# Patient Record
Sex: Male | Born: 1975 | Race: White | Hispanic: No | Marital: Married | Smoking: Never smoker
Health system: Southern US, Community
[De-identification: ages and names within clinical notes are randomized; demographics above are authoritative.]

## PROBLEM LIST (undated history)

## (undated) DIAGNOSIS — Z9189 Other specified personal risk factors, not elsewhere classified: Secondary | ICD-10-CM

## (undated) DIAGNOSIS — J45909 Unspecified asthma, uncomplicated: Secondary | ICD-10-CM

## (undated) DIAGNOSIS — M1990 Unspecified osteoarthritis, unspecified site: Secondary | ICD-10-CM

## (undated) DIAGNOSIS — G5601 Carpal tunnel syndrome, right upper limb: Secondary | ICD-10-CM

## (undated) DIAGNOSIS — K219 Gastro-esophageal reflux disease without esophagitis: Secondary | ICD-10-CM

## (undated) HISTORY — PX: FOOT SURGERY: SHX648

## (undated) NOTE — *Deleted (*Deleted)
Pt reports his oral mucosa feels irritated, upon inspection of tongue- coated brown/white and tongue more reddened. Pt was encouraged to brush his teeth freq ( after meals & before bedtime) & oral rinse after inhaler use.

---

## 1999-01-21 HISTORY — PX: LASIK: SHX215

## 2001-03-02 ENCOUNTER — Encounter: Admission: RE | Admit: 2001-03-02 | Discharge: 2001-03-02 | Payer: Self-pay | Admitting: *Deleted

## 2001-03-02 ENCOUNTER — Encounter: Payer: Self-pay | Admitting: *Deleted

## 2001-03-12 ENCOUNTER — Encounter: Payer: Self-pay | Admitting: *Deleted

## 2001-03-12 ENCOUNTER — Encounter: Admission: RE | Admit: 2001-03-12 | Discharge: 2001-03-12 | Payer: Self-pay | Admitting: *Deleted

## 2001-11-08 ENCOUNTER — Emergency Department (HOSPITAL_COMMUNITY): Admission: EM | Admit: 2001-11-08 | Discharge: 2001-11-08 | Payer: Self-pay | Admitting: Emergency Medicine

## 2003-01-21 HISTORY — PX: WISDOM TOOTH EXTRACTION: SHX21

## 2005-01-15 ENCOUNTER — Ambulatory Visit (HOSPITAL_COMMUNITY): Admission: RE | Admit: 2005-01-15 | Discharge: 2005-01-15 | Payer: Self-pay | Admitting: Family Medicine

## 2005-01-20 HISTORY — PX: SHOULDER ARTHROSCOPY WITH ROTATOR CUFF REPAIR: SHX5685

## 2013-01-20 DIAGNOSIS — G4730 Sleep apnea, unspecified: Secondary | ICD-10-CM

## 2013-01-20 HISTORY — DX: Sleep apnea, unspecified: G47.30

## 2014-04-03 ENCOUNTER — Encounter (HOSPITAL_BASED_OUTPATIENT_CLINIC_OR_DEPARTMENT_OTHER): Payer: Self-pay | Admitting: *Deleted

## 2014-04-05 ENCOUNTER — Encounter (HOSPITAL_BASED_OUTPATIENT_CLINIC_OR_DEPARTMENT_OTHER): Payer: Self-pay | Admitting: *Deleted

## 2014-04-05 NOTE — Anesthesia Preprocedure Evaluation (Addendum)
Anesthesia Evaluation  Patient identified by MRN, date of birth, ID band Patient awake    Reviewed: Allergy & Precautions, NPO status , Patient's Chart, lab work & pertinent test results  History of Anesthesia Complications Negative for: history of anesthetic complications  Airway Mallampati: II  TM Distance: >3 FB Neck ROM: Full    Dental no notable dental hx. (+) Dental Advisory Given   Pulmonary asthma ,  breath sounds clear to auscultation  Pulmonary exam normal       Cardiovascular negative cardio ROS  Rhythm:Regular Rate:Normal     Neuro/Psych negative neurological ROS  negative psych ROS   GI/Hepatic Neg liver ROS, GERD-  Medicated and Controlled,  Endo/Other  obesity  Renal/GU negative Renal ROS  negative genitourinary   Musculoskeletal  (+) Arthritis -, Osteoarthritis,    Abdominal   Peds negative pediatric ROS (+)  Hematology negative hematology ROS (+)   Anesthesia Other Findings   Reproductive/Obstetrics negative OB ROS                            Anesthesia Physical Anesthesia Plan  ASA: II  Anesthesia Plan: MAC   Post-op Pain Management:    Induction: Intravenous  Airway Management Planned:   Additional Equipment:   Intra-op Plan:   Post-operative Plan: Extubation in OR  Informed Consent: I have reviewed the patients History and Physical, chart, labs and discussed the procedure including the risks, benefits and alternatives for the proposed anesthesia with the patient or authorized representative who has indicated his/her understanding and acceptance.   Dental advisory given  Plan Discussed with: CRNA  Anesthesia Plan Comments:        Anesthesia Quick Evaluation

## 2014-04-05 NOTE — Progress Notes (Signed)
NPO AFTER MN WITH EXCEPTION CLEAR LIQUIDS UNTIL 0730 (NO CREAM/ MILK PRODUCTS). PT VERBALIZED UNDERSTANDING NO DIP TOBACCO AFTER 0730. ARRIVE AT 1215. NEEDS HG.  WILL TAKE AM MEDS W/ SIPS OF WATER DOS.

## 2014-04-05 NOTE — Progress Notes (Signed)
   04/05/14 0935  OBSTRUCTIVE SLEEP APNEA  Have you ever been diagnosed with sleep apnea through a sleep study? No  Do you snore loudly (loud enough to be heard through closed doors)?  1  Do you often feel tired, fatigued, or sleepy during the daytime? 0  Has anyone observed you stop breathing during your sleep? 0  Do you have, or are you being treated for high blood pressure? 0  BMI more than 35 kg/m2? 1  Age over 40 years old? 0  Neck circumference greater than 40 cm/16 inches? 1  Gender: 1  Obstructive Sleep Apnea Score 4

## 2014-04-06 ENCOUNTER — Encounter (HOSPITAL_BASED_OUTPATIENT_CLINIC_OR_DEPARTMENT_OTHER)
Admission: RE | Disposition: A | Discharge status: Home / Self Care | Payer: Self-pay | Source: Ambulatory Visit | Attending: Orthopedic Surgery

## 2014-04-06 ENCOUNTER — Ambulatory Visit (HOSPITAL_BASED_OUTPATIENT_CLINIC_OR_DEPARTMENT_OTHER): Payer: 59 | Admitting: Anesthesiology

## 2014-04-06 ENCOUNTER — Ambulatory Visit (HOSPITAL_BASED_OUTPATIENT_CLINIC_OR_DEPARTMENT_OTHER)
Admission: RE | Admit: 2014-04-06 | Discharge: 2014-04-06 | Disposition: A | Discharge status: Home / Self Care | Payer: 59 | Source: Ambulatory Visit | Attending: Orthopedic Surgery | Admitting: Orthopedic Surgery

## 2014-04-06 ENCOUNTER — Encounter (HOSPITAL_BASED_OUTPATIENT_CLINIC_OR_DEPARTMENT_OTHER): Payer: Self-pay | Admitting: *Deleted

## 2014-04-06 DIAGNOSIS — M1990 Unspecified osteoarthritis, unspecified site: Secondary | ICD-10-CM | POA: Diagnosis not present

## 2014-04-06 DIAGNOSIS — J45909 Unspecified asthma, uncomplicated: Secondary | ICD-10-CM | POA: Diagnosis not present

## 2014-04-06 DIAGNOSIS — G4733 Obstructive sleep apnea (adult) (pediatric): Secondary | ICD-10-CM | POA: Diagnosis not present

## 2014-04-06 DIAGNOSIS — G5601 Carpal tunnel syndrome, right upper limb: Secondary | ICD-10-CM

## 2014-04-06 DIAGNOSIS — K219 Gastro-esophageal reflux disease without esophagitis: Secondary | ICD-10-CM | POA: Insufficient documentation

## 2014-04-06 DIAGNOSIS — Z79899 Other long term (current) drug therapy: Secondary | ICD-10-CM | POA: Insufficient documentation

## 2014-04-06 HISTORY — PX: CARPAL TUNNEL RELEASE: SHX101

## 2014-04-06 HISTORY — DX: Gastro-esophageal reflux disease without esophagitis: K21.9

## 2014-04-06 HISTORY — DX: Other specified personal risk factors, not elsewhere classified: Z91.89

## 2014-04-06 HISTORY — DX: Unspecified asthma, uncomplicated: J45.909

## 2014-04-06 HISTORY — DX: Unspecified osteoarthritis, unspecified site: M19.90

## 2014-04-06 HISTORY — DX: Carpal tunnel syndrome, right upper limb: G56.01

## 2014-04-06 LAB — POCT HEMOGLOBIN-HEMACUE: Hemoglobin: 15.5 g/dL (ref 13.0–17.0)

## 2014-04-06 SURGERY — CARPAL TUNNEL RELEASE
Anesthesia: Monitor Anesthesia Care | Site: Wrist | Laterality: Right

## 2014-04-06 MED ORDER — 0.9 % SODIUM CHLORIDE (POUR BTL) OPTIME
TOPICAL | Status: DC | PRN
  Administered 2014-04-06: 1000 mL

## 2014-04-06 MED ORDER — FENTANYL CITRATE 0.05 MG/ML IJ SOLN
25.0000 ug | INTRAMUSCULAR | Status: DC | PRN
  Filled 2014-04-06: qty 1

## 2014-04-06 MED ORDER — LIDOCAINE HCL (PF) 1 % IJ SOLN
INTRAMUSCULAR | Status: DC | PRN
  Administered 2014-04-06: 10 mL

## 2014-04-06 MED ORDER — CHLORHEXIDINE GLUCONATE 4 % EX LIQD
60.0000 mL | Freq: Once | CUTANEOUS | Status: DC
  Filled 2014-04-06: qty 60

## 2014-04-06 MED ORDER — LIDOCAINE HCL (CARDIAC) 20 MG/ML IV SOLN
INTRAVENOUS | Status: DC | PRN
  Administered 2014-04-06: 50 mg via INTRAVENOUS

## 2014-04-06 MED ORDER — BUPIVACAINE HCL (PF) 0.25 % IJ SOLN
INTRAMUSCULAR | Status: DC | PRN
  Administered 2014-04-06: 10 mL

## 2014-04-06 MED ORDER — OXYCODONE-ACETAMINOPHEN 5-325 MG PO TABS: 1.0000 | ORAL_TABLET | ORAL | Status: DC | PRN

## 2014-04-06 MED ORDER — MIDAZOLAM HCL 5 MG/5ML IJ SOLN
INTRAMUSCULAR | Status: DC | PRN
  Administered 2014-04-06: 2 mg via INTRAVENOUS

## 2014-04-06 MED ORDER — MIDAZOLAM HCL 2 MG/2ML IJ SOLN
INTRAMUSCULAR | Status: AC
  Filled 2014-04-06: qty 2

## 2014-04-06 MED ORDER — CEFAZOLIN SODIUM 10 G IJ SOLR
3.0000 g | INTRAMUSCULAR | Status: AC
  Administered 2014-04-06: 3 g via INTRAVENOUS
  Filled 2014-04-06: qty 3000

## 2014-04-06 MED ORDER — PROPOFOL 10 MG/ML IV BOLUS
INTRAVENOUS | Status: DC | PRN
  Administered 2014-04-06: 30 mg via INTRAVENOUS

## 2014-04-06 MED ORDER — LACTATED RINGERS IV SOLN
INTRAVENOUS | Status: DC
Start: 2014-04-06 — End: 2014-04-06
  Administered 2014-04-06: 13:00:00 via INTRAVENOUS
  Filled 2014-04-06: qty 1000

## 2014-04-06 MED ORDER — FENTANYL CITRATE 0.05 MG/ML IJ SOLN
INTRAMUSCULAR | Status: DC | PRN
  Administered 2014-04-06: 50 ug via INTRAVENOUS

## 2014-04-06 MED ORDER — DOCUSATE SODIUM 100 MG PO CAPS: 100.0000 mg | ORAL_CAPSULE | Freq: Two times a day (BID) | ORAL | Status: DC

## 2014-04-06 MED ORDER — CEFAZOLIN SODIUM 1-5 GM-% IV SOLN
INTRAVENOUS | Status: AC
  Filled 2014-04-06: qty 50

## 2014-04-06 MED ORDER — OXYCODONE-ACETAMINOPHEN 5-325 MG PO TABS
1.0000 | ORAL_TABLET | ORAL | Status: DC | PRN
  Administered 2014-04-06: 1 via ORAL
  Filled 2014-04-06: qty 1

## 2014-04-06 MED ORDER — CEFAZOLIN SODIUM-DEXTROSE 2-3 GM-% IV SOLR
INTRAVENOUS | Status: AC
  Filled 2014-04-06: qty 50

## 2014-04-06 MED ORDER — ALBUTEROL SULFATE HFA 108 (90 BASE) MCG/ACT IN AERS
2.0000 | INHALATION_SPRAY | Freq: Once | RESPIRATORY_TRACT | Status: AC
  Administered 2014-04-06: 2 via RESPIRATORY_TRACT
  Filled 2014-04-06: qty 6.7

## 2014-04-06 MED ORDER — OXYCODONE-ACETAMINOPHEN 5-325 MG PO TABS
ORAL_TABLET | ORAL | Status: AC
  Filled 2014-04-06: qty 1

## 2014-04-06 MED ORDER — ONDANSETRON HCL 4 MG/2ML IJ SOLN
4.0000 mg | Freq: Once | INTRAMUSCULAR | Status: DC | PRN
  Filled 2014-04-06: qty 2

## 2014-04-06 MED ORDER — PROPOFOL 10 MG/ML IV EMUL
INTRAVENOUS | Status: DC | PRN
  Administered 2014-04-06: 200 ug/kg/min via INTRAVENOUS

## 2014-04-06 MED ORDER — FENTANYL CITRATE 0.05 MG/ML IJ SOLN
INTRAMUSCULAR | Status: AC
  Filled 2014-04-06: qty 4

## 2014-04-06 SURGICAL SUPPLY — 41 items
BANDAGE ELASTIC 3 VELCRO ST LF (GAUZE/BANDAGES/DRESSINGS) ×2 IMPLANT
BLADE SURG 15 STRL LF DISP TIS (BLADE) ×1 IMPLANT
BLADE SURG 15 STRL SS (BLADE) ×2
BNDG CMPR 9X4 STRL LF SNTH (GAUZE/BANDAGES/DRESSINGS) ×1
BNDG CONFORM 3 STRL LF (GAUZE/BANDAGES/DRESSINGS) ×2 IMPLANT
BNDG ESMARK 4X9 LF (GAUZE/BANDAGES/DRESSINGS) ×2 IMPLANT
CORDS BIPOLAR (ELECTRODE) IMPLANT
COVER TABLE BACK 60X90 (DRAPES) ×2 IMPLANT
CUFF TOURNIQUET SINGLE 18IN (TOURNIQUET CUFF) ×2 IMPLANT
DRAPE EXTREMITY T 121X128X90 (DRAPE) ×2 IMPLANT
DRAPE LG THREE QUARTER DISP (DRAPES) ×2 IMPLANT
DRAPE SURG 17X23 STRL (DRAPES) ×2 IMPLANT
DRSG EMULSION OIL 3X3 NADH (GAUZE/BANDAGES/DRESSINGS) IMPLANT
GAUZE XEROFORM 1X8 LF (GAUZE/BANDAGES/DRESSINGS) ×2 IMPLANT
GLOVE BIO SURGEON STRL SZ8 (GLOVE) ×2 IMPLANT
GLOVE BIOGEL PI IND STRL 8.5 (GLOVE) ×1 IMPLANT
GLOVE BIOGEL PI INDICATOR 8.5 (GLOVE) ×1
GLOVE INDICATOR 7.5 STRL GRN (GLOVE) ×1 IMPLANT
GLOVE SURG SS PI 7.5 STRL IVOR (GLOVE) ×2 IMPLANT
GOWN STRL REUS W/ TWL LRG LVL3 (GOWN DISPOSABLE) ×1 IMPLANT
GOWN STRL REUS W/ TWL XL LVL3 (GOWN DISPOSABLE) ×1 IMPLANT
GOWN STRL REUS W/TWL LRG LVL3 (GOWN DISPOSABLE) ×2
GOWN STRL REUS W/TWL XL LVL3 (GOWN DISPOSABLE) ×2
KNIFE CARPAL TUNNEL (BLADE) IMPLANT
NDL HYPO 25X1 1.5 SAFETY (NEEDLE) ×2 IMPLANT
NDL SAFETY ECLIPSE 18X1.5 (NEEDLE) IMPLANT
NEEDLE HYPO 18GX1.5 SHARP (NEEDLE) ×8
NEEDLE HYPO 25X1 1.5 SAFETY (NEEDLE) ×4 IMPLANT
NS IRRIG 500ML POUR BTL (IV SOLUTION) ×2 IMPLANT
PACK BASIN DAY SURGERY FS (CUSTOM PROCEDURE TRAY) ×2 IMPLANT
PAD ALCOHOL SWAB (MISCELLANEOUS) ×8 IMPLANT
PAD CAST 3X4 CTTN HI CHSV (CAST SUPPLIES) IMPLANT
PADDING CAST COTTON 3X4 STRL (CAST SUPPLIES) ×2
SPONGE GAUZE 4X4 12PLY STER LF (GAUZE/BANDAGES/DRESSINGS) ×2 IMPLANT
STOCKINETTE 4X48 STRL (DRAPES) ×2 IMPLANT
SUT PROLENE 4 0 PS 2 18 (SUTURE) ×2 IMPLANT
SYR BULB 3OZ (MISCELLANEOUS) ×2 IMPLANT
SYR CONTROL 10ML LL (SYRINGE) ×4 IMPLANT
TOWEL OR 17X24 6PK STRL BLUE (TOWEL DISPOSABLE) ×2 IMPLANT
TRAY DSU PREP LF (CUSTOM PROCEDURE TRAY) ×2 IMPLANT
UNDERPAD 30X30 INCONTINENT (UNDERPADS AND DIAPERS) ×2 IMPLANT

## 2014-04-06 NOTE — Brief Op Note (Signed)
04/06/2014  1:43 PM  PATIENT:  Chriss Driverhristopher A Harshfield  39 y.o. male  PRE-OPERATIVE DIAGNOSIS:  RIGHT HAND CARPAL TUNNEL SYNDROME  POST-OPERATIVE DIAGNOSIS:  * No post-op diagnosis entered *  PROCEDURE:  Procedure(s): RIGHT CARPAL TUNNEL RELEASE (Right)  SURGEON:  Surgeon(s) and Role:    * Bradly BienenstockFred Gavriel Holzhauer, MD - Primary  PHYSICIAN ASSISTANT:   ASSISTANTS: none   ANESTHESIA:   MAC  EBL:     BLOOD ADMINISTERED:none  DRAINS: none   LOCAL MEDICATIONS USED:  MARCAINE     SPECIMEN:  No Specimen  DISPOSITION OF SPECIMEN:  N/A  COUNTS:  YES  TOURNIQUET:    DICTATION: .Other Dictation: Dictation Number [id number]  PLAN OF CARE: Discharge to home after PACU  PATIENT DISPOSITION:  PACU - hemodynamically stable.   Delay start of Pharmacological VTE agent (>24hrs) due to surgical blood loss or risk of bleeding: not applicable

## 2014-04-06 NOTE — Anesthesia Procedure Notes (Signed)
Procedure Name: MAC Date/Time: 04/06/2014 2:45 PM Performed by: Tyrone NineSAUVE, Esmirna Ravan F Pre-anesthesia Checklist: Patient identified, Timeout performed, Emergency Drugs available, Suction available and Patient being monitored Patient Re-evaluated:Patient Re-evaluated prior to inductionOxygen Delivery Method: Nasal cannula Preoxygenation: Pre-oxygenation with 100% oxygen Intubation Type: IV induction Placement Confirmation: positive ETCO2 Tube secured with: Tape Dental Injury: Teeth and Oropharynx as per pre-operative assessment

## 2014-04-06 NOTE — H&P (Signed)
Kenneth [name] is an 39 y.o. male.   Chief Complaint: right hand numbness HPI: s/s c/[name] right hand carpal tunnel syndrome Pt here for surgery No prior surgery to right hand   Past Medical History  Diagnosis Date  . Carpal tunnel syndrome, right   . GERD (gastroesophageal reflux disease)   . Asthma due to environmental allergies   . At risk for sleep apnea     STOP-BANG= 4       SENT TO PCP 04-05-2014  . Arthritis     BACK    Past Surgical History  Procedure Laterality Date  . Wisdom tooth extraction  2005  . Shoulder arthroscopy with rotator cuff repair Left 2007    History reviewed. No pertinent family history. Social History:  reports that he has never smoked. His smokeless tobacco use includes Snuff. He reports that he drinks alcohol. He reports that he does not use illicit drugs.  Allergies: No Known Allergies  Medications Prior to Admission  Medication Sig Dispense Refill  . albuterol (PROVENTIL) (5 MG/ML) 0.5% nebulizer solution Take 2.5 mg by nebulization every 6 (six) hours as needed for wheezing or shortness of breath.    . cetirizine (ZYRTEC) 10 MG tablet Take 10 mg by mouth every morning.    Marland Kitchen. omeprazole (PRILOSEC) 20 MG capsule Take 20 mg by mouth 2 (two) times daily before a meal.      Results for orders placed or performed during the hospital encounter of 04/06/14 (from the past 48 hour(s))  Hemoglobin-hemacue, POC     Status: None   Collection Time: 04/06/14  1:22 PM  Result Value Ref Range   Hemoglobin 15.5 13.0 - 17.0 g/dL   No results found.  ROSNO RECENT ILLNESSES OR HOSPITALIZATIONS  Blood pressure 120/80, pulse 64, temperature 98.6 F (37 C), temperature source Oral, resp. rate 16, height 6\' 2"  (1.88 m), weight 126.554 kg (279 lb), SpO2 97 %. Physical Exam  General Appearance:  Alert, cooperative, no distress, appears stated age  Head:  Normocephalic, without obvious abnormality, atraumatic  Eyes:  Pupils equal, conjunctiva/corneas clear,          Throat: Lips, mucosa, and tongue normal; teeth and gums normal  Neck: No visible masses     Lungs:   respirations unlabored  Chest Wall:  No tenderness or deformity  Heart:  Regular rate and rhythm,  Abdomen:   Soft, non-tender,         Extremities: RIGHT HAND: FINGERS WARM WELL PERFUSED GOOD THUMB MOBILITY ABLE TO PALMAR ABDUCT THUMB GOOD DIGITAL MOTION  Pulses: 2+ and symmetric  Skin: Skin color, texture, turgor normal, no rashes or lesions     Neurologic: Normal    Assessment/Plan RIGHT HAND CARPAL TUNNEL SYNDROME  RIGHT HAND CARPAL TUNNEL RELEASE  R/B/A DISCUSSED WITH PT IN OFFICE.  PT VOICED UNDERSTANDING OF PLAN CONSENT SIGNED DAY OF SURGERY PT SEEN AND EXAMINED PRIOR TO OPERATIVE PROCEDURE/DAY OF SURGERY SITE MARKED. QUESTIONS ANSWERED WILL GO HOME FOLLOWING SURGERY  WE ARE PLANNING SURGERY FOR YOUR UPPER EXTREMITY. THE RISKS AND BENEFITS OF SURGERY INCLUDE BUT NOT LIMITED TO BLEEDING INFECTION, DAMAGE TO NEARBY NERVES ARTERIES TENDONS, FAILURE OF SURGERY TO ACCOMPLISH ITS INTENDED GOALS, PERSISTENT SYMPTOMS AND NEED FOR FURTHER SURGICAL INTERVENTION. WITH THIS IN MIND WE WILL PROCEED. I HAVE DISCUSSED WITH THE PATIENT THE PRE AND POSTOPERATIVE REGIMEN AND THE DOS AND DON'TS. PT VOICED UNDERSTANDING AND INFORMED CONSENT SIGNED.  Kenneth [name],Kenneth [name] 04/06/2014, 1:43 PM

## 2014-04-06 NOTE — Discharge Instructions (Addendum)
KEEP BANDAGE CLEAN AND DRY CALL OFFICE FOR F/U APPT [phone] IN 14 DAYS KEEP HAND ELEVATED ABOVE HEART OK TO APPLY ICE TO OPERATIVE AREA CONTACT OFFICE IF ANY WORSENING PAIN OR CONCERNS. Call your surgeon if you experience:   1.  Fever over 101.0. 2.  Inability to urinate. 3.  Nausea and/or vomiting. 4.  Extreme swelling or bruising at the surgical site. 5.  Continued bleeding from the incision. 6.  Increased pain, redness or drainage from the incision. 7.  Problems related to your pain medication. 8. Any change in color, movement and/or sensation 9. Any problems and/or concernsCall your surgeon if you experience:   1.  Fever over 101.0. 2.  Inability to urinate. 3.  Nausea and/or vomiting. 4.  Extreme swelling or bruising at the surgical site. 5.  Continued bleeding from the incision. 6.  Increased pain, redness or drainage from the incision. 7.  Problems related to your pain medication. 8. Any change in color, movement and/or sensation 9. Any problems and/or concerns  Post Anesthesia Home Care Instructions  Activity: Get plenty of rest for the remainder of the day. A responsible adult should stay with you for 24 hours following the procedure.  For the next 24 hours, DO NOT: -Drive a car -Advertising copywriterperate machinery -Drink alcoholic beverages -Take any medication unless instructed by your physician -Make any legal decisions or sign important papers.  Meals: Start with liquid foods such as gelatin or soup. Progress to regular foods as tolerated. Avoid greasy, spicy, heavy foods. If nausea and/or vomiting occur, drink only clear liquids until the nausea and/or vomiting subsides. Call your physician if vomiting continues.  Special Instructions/Symptoms: Your throat may feel dry or sore from the anesthesia or the breathing tube placed in your throat during surgery. If this causes discomfort, gargle with warm salt water. The discomfort should disappear within 24 hours.

## 2014-04-06 NOTE — Transfer of Care (Signed)
Immediate Anesthesia Transfer of Care Note  Patient: Kenneth [name]  Procedure(s) Performed: Procedure(s): RIGHT CARPAL TUNNEL RELEASE (Right)  Patient Location: PACU  Anesthesia Type:MAC  Level of Consciousness: awake, alert , oriented and patient cooperative  Airway & Oxygen Therapy: Patient Spontanous Breathing and Patient connected to nasal cannula oxygen  Post-op Assessment: Report given to RN and Post -op Vital signs reviewed and stable  Post vital signs: Reviewed and stable  Last Vitals:  Filed Vitals:   04/06/14 1245  BP: 120/80  Pulse: 64  Temp: 37 C  Resp: 16    Complications: No apparent anesthesia complications

## 2014-04-07 ENCOUNTER — Encounter (HOSPITAL_BASED_OUTPATIENT_CLINIC_OR_DEPARTMENT_OTHER): Payer: Self-pay | Admitting: Orthopedic Surgery

## 2014-04-07 NOTE — Op Note (Signed)
NAMGarnet [name]:  [name], Kenneth          ACCOUNT NO.:  [id number]  MEDICAL RECORD NO.:  [id number]  LOCATION:                               [facility]  PHYSICIAN:  Kenneth [name], Kenneth [name]  DATE OF BIRTH:  25-Apr-1975  DATE OF PROCEDURE:  04/06/2014 DATE OF DISCHARGE:  04/06/2014                              OPERATIVE REPORT   PREOPERATIVE DIAGNOSIS:  Right hand carpal tunnel syndrome.  POSTOPERATIVE DIAGNOSIS:  Right hand carpal tunnel syndrome.  ATTENDING PHYSICIAN:  Kenneth [name], Kenneth [name] who scrubbed and present for the entire procedure.  ASSISTANT SURGEON:  None.  ANESTHESIA:  Xylocaine 1% and 0.25% Marcaine local block with [name] sedation.  SURGICAL INDICATIONS:  [name] is a 39 year old right-hand-dominant gentleman with signs and symptoms consistent with right hand carpal tunnel syndrome.  This has been refractory to conservative treatment. Risks, benefits, and alternatives were discussed in detail with the patient.  Signed informed consent was obtained.  Risks include, but not limited to bleeding, infection, damage to nearby nerves, arteries, or tendons, loss of motion in the wrist and digits, incomplete relief of symptoms, and need for further surgical intervention.  DESCRIPTION OF PROCEDURE:  The patient was properly identified in the preoperative holding area and a mark with a permanent marker made on the right hand to indicate the correct operative site.  The patient was then brought back to the operating room, placed supine on the anesthesia room table where general anesthesia with [name] sedation was administered.  The patient tolerated this well.  A well-padded tourniquet was placed on the right brachium and sealed with 1000 drape.  The right forearm was sealed with 1000 drape.  Local anesthetic was administered.  Time-out was called, correct side was identified, and the procedure then begun.  The right upper extremity was then prepped and draped in normal  sterile fashion.  A 7-cm incision made directly in mid palm.  Dissection was carried down through the skin and subcutaneous tissue.  Direct exposure to transverse carpal ligament carried out under direct visualization, distal one-half of the transverse carpal ligament released in its entirety distally.  Further exposure was carried out proximally on the main course, the transverse carpal ligament was then released, it was very difficult.  The incision was extended just at the level of the wrist crease in order to obtain exposure.  There was rather significant scar tissue at the distal wrist crease.  It was difficult to place the blunt retractors in, and therefore the extension in the incision.  Nerve was then carefully visualized and protected around.  A portion of the transverse carpal ligament and antebrachial fascia were released under direct visualization.  Thorough wound irrigation [name] throughout.  The wound was then irrigated, the tourniquet deflated, hemostasis was obtained.  Skin was then closed using horizontal mattress Prolene sutures.  The contents of the carpal canal had been inspected.  No other abnormalities noted.  A Xeroform dressing and a sterile compressive bandage were then applied.  The patient tolerated the procedure well. Returned to the recovery room in good condition.  POSTPROCEDURE PLAN:  The patient will be discharged to home, seen back in the office in approximately 2 weeks for  wound check, suture removal, transition into a padded glove, and then begin a postoperative carpal tunnel eval and treat this.     Kenneth [name], Kenneth [name]     FWO/MEDQ  D:  04/06/2014  T:  04/07/2014  Job:  [id number]

## 2014-04-07 NOTE — Anesthesia Postprocedure Evaluation (Signed)
  Anesthesia Post-op Note  Patient: Kenneth [name]  Procedure(s) Performed: Procedure(s) (LRB): RIGHT CARPAL TUNNEL RELEASE (Right)  Patient Location: PACU  Anesthesia Type: General  Level of Consciousness: awake and alert   Airway and Oxygen Therapy: Patient Spontanous Breathing  Post-op Pain: mild  Post-op Assessment: Post-op Vital signs reviewed, Patient's Cardiovascular Status Stable, Respiratory Function Stable, Patent Airway and No signs of Nausea or vomiting  Last Vitals:  Filed Vitals:   04/06/14 1639  BP: 123/75  Pulse: 60  Temp: 36.5 C  Resp: 16    Post-op Vital Signs: stable   Complications: No apparent anesthesia complications

## 2017-01-20 HISTORY — PX: CARPAL TUNNEL RELEASE: SHX101

## 2017-01-20 HISTORY — PX: HERNIA REPAIR: SHX51

## 2019-12-09 ENCOUNTER — Encounter (HOSPITAL_COMMUNITY): Payer: Self-pay

## 2019-12-09 ENCOUNTER — Inpatient Hospital Stay (HOSPITAL_COMMUNITY)
Admission: EM | Admit: 2019-12-09 | Discharge: 2019-12-16 | DRG: 177 | Disposition: A | Discharge status: Home / Self Care | Payer: 59 | Attending: Internal Medicine | Admitting: Internal Medicine

## 2019-12-09 ENCOUNTER — Emergency Department (HOSPITAL_COMMUNITY): Payer: 59

## 2019-12-09 ENCOUNTER — Other Ambulatory Visit: Payer: Self-pay

## 2019-12-09 DIAGNOSIS — R7989 Other specified abnormal findings of blood chemistry: Secondary | ICD-10-CM | POA: Diagnosis present

## 2019-12-09 DIAGNOSIS — E785 Hyperlipidemia, unspecified: Secondary | ICD-10-CM | POA: Diagnosis present

## 2019-12-09 DIAGNOSIS — J9601 Acute respiratory failure with hypoxia: Secondary | ICD-10-CM

## 2019-12-09 DIAGNOSIS — Z6841 Body Mass Index (BMI) 40.0 and over, adult: Secondary | ICD-10-CM

## 2019-12-09 DIAGNOSIS — J188 Other pneumonia, unspecified organism: Secondary | ICD-10-CM | POA: Diagnosis present

## 2019-12-09 DIAGNOSIS — J1282 Pneumonia due to coronavirus disease 2019: Secondary | ICD-10-CM | POA: Diagnosis present

## 2019-12-09 DIAGNOSIS — R7303 Prediabetes: Secondary | ICD-10-CM | POA: Diagnosis not present

## 2019-12-09 DIAGNOSIS — F17220 Nicotine dependence, chewing tobacco, uncomplicated: Secondary | ICD-10-CM | POA: Diagnosis present

## 2019-12-09 DIAGNOSIS — B370 Candidal stomatitis: Secondary | ICD-10-CM | POA: Diagnosis not present

## 2019-12-09 DIAGNOSIS — G4733 Obstructive sleep apnea (adult) (pediatric): Secondary | ICD-10-CM | POA: Diagnosis present

## 2019-12-09 DIAGNOSIS — J189 Pneumonia, unspecified organism: Secondary | ICD-10-CM | POA: Diagnosis present

## 2019-12-09 DIAGNOSIS — K219 Gastro-esophageal reflux disease without esophagitis: Secondary | ICD-10-CM | POA: Diagnosis present

## 2019-12-09 DIAGNOSIS — U071 COVID-19: Secondary | ICD-10-CM | POA: Diagnosis present

## 2019-12-09 DIAGNOSIS — D6959 Other secondary thrombocytopenia: Secondary | ICD-10-CM | POA: Diagnosis present

## 2019-12-09 DIAGNOSIS — E871 Hypo-osmolality and hyponatremia: Secondary | ICD-10-CM | POA: Diagnosis present

## 2019-12-09 DIAGNOSIS — J45909 Unspecified asthma, uncomplicated: Secondary | ICD-10-CM | POA: Diagnosis present

## 2019-12-09 DIAGNOSIS — Z79899 Other long term (current) drug therapy: Secondary | ICD-10-CM | POA: Diagnosis not present

## 2019-12-09 LAB — TRIGLYCERIDES: Triglycerides: 78 mg/dL (ref <150)

## 2019-12-09 LAB — COMPREHENSIVE METABOLIC PANEL
ALT: 154 U/L — ABNORMAL HIGH (ref 0–44)
AST: 248 U/L — ABNORMAL HIGH (ref 15–41)
Albumin: 3.7 g/dL (ref 3.5–5.0)
Alkaline Phosphatase: 50 U/L (ref 38–126)
Anion gap: 10 (ref 5–15)
BUN: 13 mg/dL (ref 6–20)
CO2: 25 mmol/L (ref 22–32)
Calcium: 7.9 mg/dL — ABNORMAL LOW (ref 8.9–10.3)
Chloride: 96 mmol/L — ABNORMAL LOW (ref 98–111)
Creatinine, Ser: 1.1 mg/dL (ref 0.61–1.24)
GFR, Estimated: >60 mL/min (ref >60)
Glucose, Bld: 118 mg/dL — ABNORMAL HIGH (ref 70–99)
Potassium: 4.5 mmol/L (ref 3.5–5.1)
Sodium: 131 mmol/L — ABNORMAL LOW (ref 135–145)
Total Bilirubin: 0.4 mg/dL (ref 0.3–1.2)
Total Protein: 6.8 g/dL (ref 6.5–8.1)

## 2019-12-09 LAB — HEPATITIS PANEL, ACUTE
HCV Ab: NONREACTIVE
Hep A IgM: NONREACTIVE
Hep B C IgM: NONREACTIVE
Hepatitis B Surface Ag: NONREACTIVE

## 2019-12-09 LAB — CBC WITH DIFFERENTIAL/PLATELET
Abs Immature Granulocytes: 0.09 10*3/uL — ABNORMAL HIGH (ref 0.00–0.07)
Basophils Absolute: 0 10*3/uL (ref 0.0–0.1)
Basophils Relative: 0 %
Eosinophils Absolute: 0 10*3/uL (ref 0.0–0.5)
Eosinophils Relative: 0 %
HCT: 44 % (ref 39.0–52.0)
Hemoglobin: 15.1 g/dL (ref 13.0–17.0)
Immature Granulocytes: 2 %
Lymphocytes Relative: 13 %
Lymphs Abs: 0.7 10*3/uL (ref 0.7–4.0)
MCH: 31.7 pg (ref 26.0–34.0)
MCHC: 34.3 g/dL (ref 30.0–36.0)
MCV: 92.2 fL (ref 80.0–100.0)
Monocytes Absolute: 0.3 10*3/uL (ref 0.1–1.0)
Monocytes Relative: 6 %
Neutro Abs: 4.2 10*3/uL (ref 1.7–7.7)
Neutrophils Relative %: 79 %
Platelets: 148 10*3/uL — ABNORMAL LOW (ref 150–400)
RBC: 4.77 MIL/uL (ref 4.22–5.81)
RDW: 12.8 % (ref 11.5–15.5)
WBC: 5.2 10*3/uL (ref 4.0–10.5)
nRBC: 0 % (ref 0.0–0.2)

## 2019-12-09 LAB — FERRITIN: Ferritin: 2208 ng/mL — ABNORMAL HIGH (ref 24–336)

## 2019-12-09 LAB — C-REACTIVE PROTEIN: CRP: 7.8 mg/dL — ABNORMAL HIGH (ref <1.0)

## 2019-12-09 LAB — RESP PANEL BY RT-PCR (FLU A&B, COVID) ARPGX2
Influenza A by PCR: NEGATIVE
Influenza B by PCR: NEGATIVE
SARS Coronavirus 2 by RT PCR: POSITIVE — AB

## 2019-12-09 LAB — PROCALCITONIN: Procalcitonin: 0.39 ng/mL

## 2019-12-09 LAB — LACTIC ACID, PLASMA: Lactic Acid, Venous: 0.9 mmol/L (ref 0.5–1.9)

## 2019-12-09 LAB — FIBRINOGEN: Fibrinogen: 520 mg/dL — ABNORMAL HIGH (ref 210–475)

## 2019-12-09 LAB — LACTATE DEHYDROGENASE: LDH: 764 U/L — ABNORMAL HIGH (ref 98–192)

## 2019-12-09 LAB — HIV ANTIBODY (ROUTINE TESTING W REFLEX): HIV Screen 4th Generation wRfx: NONREACTIVE

## 2019-12-09 LAB — D-DIMER, QUANTITATIVE: D-Dimer, Quant: 1.99 ug/mL-FEU — ABNORMAL HIGH (ref 0.00–0.50)

## 2019-12-09 MED ORDER — HYDROCOD POLST-CPM POLST ER 10-8 MG/5ML PO SUER
5.0000 mL | Freq: Two times a day (BID) | ORAL | Status: DC | PRN
  Administered 2019-12-10 – 2019-12-11 (×2): 5 mL via ORAL
  Filled 2019-12-09 (×2): qty 5

## 2019-12-09 MED ORDER — ENOXAPARIN SODIUM 40 MG/0.4ML ~~LOC~~ SOLN
40.0000 mg | SUBCUTANEOUS | Status: DC
  Administered 2019-12-09: 40 mg via SUBCUTANEOUS
  Filled 2019-12-09: qty 0.4

## 2019-12-09 MED ORDER — ASCORBIC ACID 500 MG PO TABS
500.0000 mg | ORAL_TABLET | Freq: Every day | ORAL | Status: DC
  Administered 2019-12-09 – 2019-12-16 (×8): 500 mg via ORAL
  Filled 2019-12-09 (×8): qty 1

## 2019-12-09 MED ORDER — ONDANSETRON HCL 4 MG PO TABS: 4.0000 mg | ORAL_TABLET | Freq: Four times a day (QID) | ORAL | Status: DC | PRN

## 2019-12-09 MED ORDER — BARICITINIB 2 MG PO TABS
4.0000 mg | ORAL_TABLET | Freq: Every day | ORAL | Status: DC
  Administered 2019-12-09 – 2019-12-16 (×8): 4 mg via ORAL
  Filled 2019-12-09 (×8): qty 2

## 2019-12-09 MED ORDER — PREDNISONE 20 MG PO TABS: 50.0000 mg | ORAL_TABLET | Freq: Every day | ORAL | Status: DC

## 2019-12-09 MED ORDER — METHYLPREDNISOLONE SODIUM SUCC 125 MG IJ SOLR
125.0000 mg | Freq: Once | INTRAMUSCULAR | Status: AC
  Administered 2019-12-09: 125 mg via INTRAVENOUS
  Filled 2019-12-09: qty 2

## 2019-12-09 MED ORDER — ACETAMINOPHEN 325 MG PO TABS
650.0000 mg | ORAL_TABLET | Freq: Once | ORAL | Status: AC
  Administered 2019-12-09: 650 mg via ORAL
  Filled 2019-12-09: qty 2

## 2019-12-09 MED ORDER — PANTOPRAZOLE SODIUM 40 MG PO TBEC
40.0000 mg | DELAYED_RELEASE_TABLET | Freq: Every day | ORAL | Status: DC
  Administered 2019-12-09 – 2019-12-16 (×8): 40 mg via ORAL
  Filled 2019-12-09 (×8): qty 1

## 2019-12-09 MED ORDER — SODIUM CHLORIDE 0.9 % IV SOLN: 200.0000 mg | Freq: Once | INTRAVENOUS | Status: DC

## 2019-12-09 MED ORDER — ALBUTEROL SULFATE HFA 108 (90 BASE) MCG/ACT IN AERS
2.0000 | INHALATION_SPRAY | Freq: Once | RESPIRATORY_TRACT | Status: AC
  Administered 2019-12-09: 2 via RESPIRATORY_TRACT
  Filled 2019-12-09: qty 6.7

## 2019-12-09 MED ORDER — IPRATROPIUM-ALBUTEROL 20-100 MCG/ACT IN AERS
1.0000 | INHALATION_SPRAY | Freq: Four times a day (QID) | RESPIRATORY_TRACT | Status: DC
  Administered 2019-12-09 – 2019-12-15 (×23): 1 via RESPIRATORY_TRACT
  Filled 2019-12-09 (×3): qty 4

## 2019-12-09 MED ORDER — SODIUM CHLORIDE 0.9 % IV SOLN
100.0000 mg | Freq: Every day | INTRAVENOUS | Status: DC
  Administered 2019-12-10 – 2019-12-12 (×3): 100 mg via INTRAVENOUS
  Filled 2019-12-09 (×3): qty 20

## 2019-12-09 MED ORDER — SODIUM CHLORIDE 0.9 % IV SOLN
200.0000 mg | Freq: Once | INTRAVENOUS | Status: AC
  Administered 2019-12-09: 200 mg via INTRAVENOUS
  Filled 2019-12-09: qty 200

## 2019-12-09 MED ORDER — GUAIFENESIN-DM 100-10 MG/5ML PO SYRP
10.0000 mL | ORAL_SOLUTION | ORAL | Status: DC | PRN
  Administered 2019-12-09 – 2019-12-15 (×3): 10 mL via ORAL
  Filled 2019-12-09 (×3): qty 10

## 2019-12-09 MED ORDER — ONDANSETRON HCL 4 MG/2ML IJ SOLN
4.0000 mg | Freq: Once | INTRAMUSCULAR | Status: AC
  Administered 2019-12-09: 4 mg via INTRAVENOUS
  Filled 2019-12-09: qty 2

## 2019-12-09 MED ORDER — ACETAMINOPHEN 325 MG PO TABS
650.0000 mg | ORAL_TABLET | Freq: Four times a day (QID) | ORAL | Status: DC | PRN
  Administered 2019-12-11: 650 mg via ORAL
  Filled 2019-12-09: qty 2

## 2019-12-09 MED ORDER — SODIUM CHLORIDE 0.9 % IV BOLUS
1000.0000 mL | Freq: Once | INTRAVENOUS | Status: AC
  Administered 2019-12-09: 1000 mL via INTRAVENOUS

## 2019-12-09 MED ORDER — ROSUVASTATIN CALCIUM 20 MG PO TABS: 20.0000 mg | ORAL_TABLET | Freq: Every day | ORAL | Status: DC

## 2019-12-09 MED ORDER — ONDANSETRON HCL 4 MG/2ML IJ SOLN: 4.0000 mg | Freq: Four times a day (QID) | INTRAMUSCULAR | Status: DC | PRN

## 2019-12-09 MED ORDER — ZINC SULFATE 220 (50 ZN) MG PO CAPS
220.0000 mg | ORAL_CAPSULE | Freq: Every day | ORAL | Status: DC
  Administered 2019-12-09 – 2019-12-16 (×8): 220 mg via ORAL
  Filled 2019-12-09 (×8): qty 1

## 2019-12-09 MED ORDER — SODIUM CHLORIDE 0.9 % IV SOLN
1.0000 mg/kg | Freq: Two times a day (BID) | INTRAVENOUS | Status: DC
  Administered 2019-12-09 – 2019-12-11 (×4): 150 mg via INTRAVENOUS
  Filled 2019-12-09 (×5): qty 1.2

## 2019-12-09 MED ORDER — SODIUM CHLORIDE 0.9 % IV SOLN: 100.0000 mg | Freq: Every day | INTRAVENOUS | Status: DC

## 2019-12-09 NOTE — Progress Notes (Signed)
Patient came in the unit via stretcher accompanied by RN and ED staff. Pt. Is alert and oriented, on 5L nasal canula. Vital sign taken and recorded, oriented to room and use of call bell. Patient hooked on tele, needs attended to.

## 2019-12-09 NOTE — ED Provider Notes (Signed)
Kenneth [name] Provider Note   CSN: [id number] Arrival date & time: 12/09/19  0745     History Chief Complaint  Patient presents with   Covid Positive    Kenneth [name] is a 44 y.o. male with history significant for OSA, asthma, obesity who presents for evaluation of shortness of breath and feeling unwell.  Patient Day 9 of positive Covid test.  Took at home test.  Unvaccinated.  Has been seen by his PCP as well as urgent care.  He was given prednisone taper for 1 week as well as Decadron and azithromycin and Tessalon Perles.  Patient states he is continuing to feel unwell.  He denies any chest pain or hemoptysis.  Feels dyspneic with mild ambulation.  No lateral leg swelling, redness or warmth.  No prior history of PE or DVT.  Has had some lightheadedness.  No syncope however did fall due to his lightheadedness.  Denies hitting head, LOC or anticoagulation.  Patient with cough productive of clear sputum.  States he has generalized abdominal pain.  No pain radiating to his back.  No dysuria, hematuria.  No associated diarrhea, nausea or vomiting.  Denies headache, vision changes, paresthesias, unilateral weakness, chest pain. No melena or BRBPR. Denies additional aggravating or relieving factors. Feels generally weak. Continuous fever at home. Last Tylenol 2300. Using Albuterol at home WO relief of SOB.  Wife here for similar complaints, S/p COVID positive with abd pain, N/V/D.  History obtained from patient and past medical records.  No interpreter is used.  HPI     Past Medical History:  Diagnosis Date   Arthritis    BACK   Asthma due to environmental allergies    At risk for sleep apnea    STOP-BANG= 4       SENT TO PCP 04-05-2014   Carpal tunnel syndrome, right    GERD (gastroesophageal reflux disease)     Patient Active Problem List   Diagnosis Date Noted   Multifocal pneumonia 12/09/2019    Past Surgical History:  Procedure  Laterality Date   CARPAL TUNNEL RELEASE Right 04/06/2014   Procedure: RIGHT CARPAL TUNNEL RELEASE;  Surgeon: Iran Planas, MD;  Location: [place];  Service: Orthopedics;  Laterality: Right;   SHOULDER ARTHROSCOPY WITH ROTATOR CUFF REPAIR Left 2007   WISDOM TOOTH EXTRACTION  2005       History reviewed. No pertinent family history.  Social History   Tobacco Use   Smoking status: Never Smoker   Smokeless tobacco: Current User    Types: Snuff  Substance Use Topics   Alcohol use: Yes    Comment: occasional   Drug use: No    Home Medications Prior to Admission medications   Medication Sig Start Date End Date Taking? Authorizing Provider  azithromycin (ZITHROMAX) 250 MG tablet Take 250 mg by mouth as directed. Take two tablets on the first day and then one tablet every day after. 12/05/19  Yes [provider]  benzonatate (TESSALON) 100 MG capsule Take 100 mg by mouth 3 (three) times daily. For 7 days 12/05/19 12/12/19 Yes [provider]  cetirizine (ZYRTEC) 10 MG tablet Take 10 mg by mouth daily as needed for allergies.    Yes [provider]  pantoprazole (PROTONIX) 40 MG tablet Take 40 mg by mouth daily. 11/30/19  Yes [provider]  rosuvastatin (CRESTOR) 20 MG tablet Take 20 mg by mouth at bedtime. 11/21/19  Yes [provider]  testosterone cypionate (  DEPOTESTOSTERONE CYPIONATE) 200 MG/ML injection Inject 1 mL into the muscle every 14 (fourteen) days. 11/30/19  Yes [provider]  docusate sodium (COLACE) 100 MG capsule Take 1 capsule (100 mg total) by mouth 2 (two) times daily. Patient not taking: Reported on 12/09/2019 04/06/14   Iran Planas, MD  oxyCODONE-acetaminophen (ROXICET) 5-325 MG per tablet Take 1 tablet by mouth every 4 (four) hours as needed for severe pain. Patient not taking: Reported on 12/09/2019 04/06/14   Iran Planas, MD    Allergies    Patient has no known allergies.  Review  of Systems   Review of Systems  Constitutional: Positive for activity change, appetite change, chills, fatigue and fever.  HENT: Positive for congestion and rhinorrhea.   Respiratory: Positive for cough and shortness of breath. Negative for apnea, choking, chest tightness, wheezing and stridor.   Cardiovascular: Negative.   Gastrointestinal: Positive for abdominal pain. Negative for abdominal distention, anal bleeding, blood in stool, constipation, diarrhea, nausea, rectal pain and vomiting.  Genitourinary: Negative.   Musculoskeletal: Negative.   Skin: Negative.   Neurological: Positive for weakness and light-headedness. Negative for dizziness, tremors, seizures, syncope, facial asymmetry, speech difficulty, numbness and headaches.  All other systems reviewed and are negative.   Physical Exam Updated Vital Signs BP (!) 141/111    Pulse 90    Temp (!) 101.1 F (38.4 C)    Resp (!) 31    SpO2 94%   Physical Exam Vitals and nursing note reviewed.  Constitutional:      General: He is not in acute distress.    Appearance: He is obese. He is ill-appearing. He is not toxic-appearing or diaphoretic.  HENT:     Head: Normocephalic and atraumatic.     Jaw: There is normal jaw occlusion.     Right Ear: Tympanic membrane, ear canal and external ear normal. There is no impacted cerumen. No hemotympanum. Tympanic membrane is not injected, scarred, perforated, erythematous, retracted or bulging.     Left Ear: Tympanic membrane, ear canal and external ear normal. There is no impacted cerumen. No hemotympanum. Tympanic membrane is not injected, scarred, perforated, erythematous, retracted or bulging.     Ears:     Comments: No Mastoid tenderness.    Nose:     Comments: Clear rhinorrhea and congestion to bilateral nares.  No sinus tenderness.    Mouth/Throat:     Comments: Posterior oropharynx clear.  Mucous membranes moist.  Tonsils without erythema or exudate.  Uvula midline without deviation.  No  evidence of PTA or RPA.  No drooling, dysphasia or trismus.  Phonation normal. Neck:     Trachea: Trachea and phonation normal.     Meningeal: Brudzinski's sign and Kernig's sign absent.     Comments: No Neck stiffness or neck rigidity.  No meningismus.  No cervical lymphadenopathy. Cardiovascular:     Pulses: Normal pulses.          Radial pulses are 2+ on the right side and 2+ on the left side.       Dorsalis pedis pulses are 2+ on the right side and 2+ on the left side.     Heart sounds: Normal heart sounds.     Comments: No murmurs rubs or gallops. Pulmonary:     Effort: Tachypnea present.     Comments: No wheeze.  Mild basilar rhonchi.  Speaks in short sentences Abdominal:     Comments: Soft, nontender without rebound or guarding.  No CVA tenderness.  Musculoskeletal:  Comments: Moves all 4 extremities without difficulty.  Lower extremities without edema, erythema or warmth. Homans sign negative.  Skin:    General: Skin is warm.     Capillary Refill: Capillary refill takes 2 to 3 seconds.     Comments: Brisk capillary refill.  No rashes or lesions. Tactile temp to extremities  Neurological:     Mental Status: He is alert.     Comments: Ambulatory in department without difficulty.  Cranial nerves II through XII grossly intact.  No facial droop.  No aphasia.    ED Results / Procedures / Treatments   Labs (all labs ordered are listed, but only abnormal results are displayed) Labs Reviewed  CBC WITH DIFFERENTIAL/PLATELET - Abnormal; Notable for the following components:      Result Value   Platelets 148 (*)    Abs Immature Granulocytes 0.09 (*)    All other components within normal limits  COMPREHENSIVE METABOLIC PANEL - Abnormal; Notable for the following components:   Sodium 131 (*)    Chloride 96 (*)    Glucose, Bld 118 (*)    Calcium 7.9 (*)    AST 248 (*)    ALT 154 (*)    All other components within normal limits  D-DIMER, QUANTITATIVE (NOT AT [facility]) - Abnormal;  Notable for the following components:   D-Dimer, Quant 1.99 (*)    All other components within normal limits  LACTATE DEHYDROGENASE - Abnormal; Notable for the following components:   LDH 764 (*)    All other components within normal limits  FERRITIN - Abnormal; Notable for the following components:   Ferritin 2,208 (*)    All other components within normal limits  FIBRINOGEN - Abnormal; Notable for the following components:   Fibrinogen 520 (*)    All other components within normal limits  C-REACTIVE PROTEIN - Abnormal; Notable for the following components:   CRP 7.8 (*)    All other components within normal limits  CULTURE, BLOOD (ROUTINE X 2)  CULTURE, BLOOD (ROUTINE X 2)  RESP PANEL BY RT-PCR (FLU A&B, COVID) ARPGX2  LACTIC ACID, PLASMA  PROCALCITONIN  TRIGLYCERIDES  HIV ANTIBODY (ROUTINE TESTING W REFLEX)  CBC  CREATININE, SERUM    EKG None  Radiology DG Chest Port 1 View  Result Date: 12/09/2019 CLINICAL DATA:  Shortness of breath, COVID positive with worsening symptoms EXAM: PORTABLE CHEST 1 VIEW COMPARISON:  December of 2006, no recent comparison is available. FINDINGS: Lung volumes are decreased. Cardiomediastinal contours accentuated by portable technique and low lung volumes. Patchy peripheral and mid chest to basilar opacities bilaterally. No lobar consolidation. No sign of pleural effusion. On limited assessment no acute skeletal process. IMPRESSION: Low volume chest with patchy peripheral and mid chest to basilar opacities bilaterally, compatible with pneumonia, pattern could be seen in the setting of COVID infection. Electronically Signed   By: Zetta Bills M.D.   On: 12/09/2019 09:00    Procedures .Critical Care Performed by: Nettie Elm, PA-C Authorized by: Nettie Elm, PA-C   Critical care provider statement:    Critical care time (minutes):  45   Critical care was necessary to treat or prevent imminent or life-threatening deterioration of  the following conditions:  Respiratory failure   Critical care was time spent personally by me on the following activities:  Discussions with consultants, evaluation of patient's response to treatment, examination of patient, ordering and performing treatments and interventions, ordering and review of laboratory studies, ordering and review of radiographic studies,  pulse oximetry, re-evaluation of patient's condition, obtaining history from patient or surrogate and review of old charts   (including critical care time)  Medications Ordered in ED Medications  rosuvastatin (CRESTOR) tablet 20 mg (has no administration in time range)  pantoprazole (PROTONIX) EC tablet 40 mg (has no administration in time range)  enoxaparin (LOVENOX) injection 40 mg (has no administration in time range)  remdesivir 200 mg in sodium chloride 0.9% 250 mL IVPB (has no administration in time range)    Followed by  remdesivir 100 mg in sodium chloride 0.9 % 100 mL IVPB (has no administration in time range)  baricitinib (OLUMIANT) tablet 4 mg (has no administration in time range)  Ipratropium-Albuterol (COMBIVENT) respimat 1 puff (has no administration in time range)  methylPREDNISolone sodium succinate (SOLU-MEDROL) injection 1 mg/kg (has no administration in time range)    Followed by  predniSONE (DELTASONE) tablet 50 mg (has no administration in time range)  guaiFENesin-dextromethorphan (ROBITUSSIN DM) 100-10 MG/5ML syrup 10 mL (has no administration in time range)  chlorpheniramine-HYDROcodone (TUSSIONEX) 10-8 MG/5ML suspension 5 mL (has no administration in time range)  ascorbic acid (VITAMIN C) tablet 500 mg (has no administration in time range)  zinc sulfate capsule 220 mg (has no administration in time range)  acetaminophen (TYLENOL) tablet 650 mg (has no administration in time range)  ondansetron (ZOFRAN) tablet 4 mg (has no administration in time range)    Or  ondansetron (ZOFRAN) injection 4 mg (has no  administration in time range)  remdesivir 200 mg in sodium chloride 0.9% 250 mL IVPB (has no administration in time range)    Followed by  remdesivir 100 mg in sodium chloride 0.9 % 100 mL IVPB (has no administration in time range)  sodium chloride 0.9 % bolus 1,000 mL (1,000 mLs Intravenous New Bag/Given 12/09/19 0853)  acetaminophen (TYLENOL) tablet 650 mg (650 mg Oral Given 12/09/19 0853)  methylPREDNISolone sodium succinate (SOLU-MEDROL) 125 mg/2 mL injection 125 mg (125 mg Intravenous Given 12/09/19 0855)  albuterol (VENTOLIN HFA) 108 (90 Base) MCG/ACT inhaler 2 puff (2 puffs Inhalation Given 12/09/19 0854)  ondansetron (ZOFRAN) injection 4 mg (4 mg Intravenous Given 12/09/19 1003)    ED Course  I have reviewed the triage vital signs and the nursing notes.  Pertinent labs & imaging results that were available during my care of the patient were reviewed by me and considered in my medical decision making (see chart for details).  44 year old, Covid positive on at home test presents for evaluation of feeling unwell.  On arrival patient febrile and hypoxic with some mild tachypnea.  Does appear moderately dyspneic speaking in short sentences.  Persistent fever at home, given Tylenol here.  Placed on supplemental oxygen.  Heart clear.  Abdomen soft without tenderness.  No rebound or guarding.  Does admit to some generalized abdominal pain without nausea, vomiting or diarrhea.  Pain does not radiate into back.  No midline pulsatile abdominal mass.  Neurovascularly intact.  Low suspicion for AAA, dissection, bacterial infectious process causing patient's abdomen pain.  Using nebs at home.  Has been on a course of prednisone, Decadron as well as azithromycin.  Seen by PCP as well as urgent care over the last week.  Plan on labs, imaging and reassess.  Patient will likely need admission for acute hypoxic respiratory failure due to Covid.  At this time I have low suspicion for PE.  Labs and imaging  personally reviewed and interpreted:  CBC without leukocytosis CMP mild hyponatremia to 133, glucose  118, AST, ALT elevation however normal alk phos, T bili.  Negative Murphy sign.  Low suspicion for gallbladder, liver pathology Ddimer 1.99 no chest pain, hemoptysis, lateral leg swelling, redness or warmth.  Low suspicion for PE Lactic 0.9 BC pending COVID pending, multiple positive test at home DG chest multifocal pneumonia EKG without ischemia  Patient reassessed. 4  Liters of oxygen via nasal cannula.  Appears comfortable. Discussed admission.  He is agreeable to this.  Will be admitted for hypoxic respiratory failure likely due to his Covid pneumonia.  Suspicion for acute ACS, PE, dissection, bacterial infectious process at this time.  Hypoxia and shortness of breath do not seem consistent with asthma exacerbation patient has history of.  The patient appears reasonably stabilized for admission considering the current resources, flow, and capabilities available in the ED at this time, and I doubt any other [facility] requiring further screening and/or treatment in the ED prior to admission.  Patient seen evaluated by attending, Dr. Johnney Killian who agrees above treatment, plan and disposition.    MDM Rules/Calculators/A&P                          Kenneth [name] was evaluated in Emergency Department on 12/09/2019 for the symptoms described in the history of present illness. He was evaluated in the context of the global COVID-19 pandemic, which necessitated consideration that the patient might be at risk for infection with the SARS-CoV-2 virus that causes COVID-19. Institutional protocols and algorithms that pertain to the evaluation of patients at risk for COVID-19 are in a state of rapid change based on information released by regulatory bodies including the CDC and federal and state organizations. These policies and algorithms were followed during the patient's care in the ED. Final Clinical  Impression(s) / ED Diagnoses Final diagnoses:  Pneumonia due to COVID-19 virus  Acute respiratory failure with hypoxia (HCC)  Elevated d-dimer  Elevated LFTs    Rx / DC Orders ED Discharge Orders    None       Donnamarie Shankles A, PA-C 12/09/19 1203    Charlesetta Shanks, MD 12/11/19 [phone]

## 2019-12-09 NOTE — ED Provider Notes (Signed)
Medical screening examination/treatment/procedure(s) were conducted as a shared visit with non-physician practitioner(s) and myself.  I personally evaluated the patient during the encounter.    Patient experiencing worsening cough and shortness of breath after diagnosis of COVID-19, day 9 after positive test.  He has been seen outpatient basis symmetry as well as prednisone and Decadron as well as azithromycin.  Patient is alert.  He is ill in appearance.  Tachypnea at rest.  Mental status is clear.  Lungs have crackles bilateral lower to mid lung fields.  Heart regular with borderline tachycardia.  Abdomen soft and nontender.  No peripheral edema.  Calves soft nontender.  Patient presents with worsening symptoms of COVID-19.  Clinically, patient has pneumonia with crackles on exam and hypoxia.  Patient will need admission.  I agree with plan of management.   Arby Barrette, MD 12/09/19 [phone]

## 2019-12-09 NOTE — H&P (Signed)
History and Physical    Kenneth [name] EQA:[id number] DOB: November 02, 1975 DOA: 12/09/2019  PCP: Eartha Inch, MD  Patient coming from: Home  Chief Complaint: shortness of breath  HPI: Kenneth [name] is a 44 y.o. male with medical history significant of HLD. Presenting with fever, dyspnea, and cough. He reports that 10 days ago he had a fever that seemed to last all day. He can not tell me the actual temp, but it didn't seem to respond to OTC meds. When it continued into the next day, he became concerned because he wife had recently tested positive for COVID. He did a home test and was found positive for COVID. He took some more OTC meds (APAP, ibuprofen, and a decongestant). They didn't help. He went to urgent care and was given prednisone and azithromycin. These didn't help. His dyspnea progressed to the point that he was too uncomfortable at home. He then came to the ED.   He has not received the COVID vaccine.   ED Course: He was found to have elevated inflammatory markers. CXR showed multifocal PNA. He was started on solumedrol. TR[facility] was called for admission.   Review of Systems:  Denies CP, palpitation, N/V/D, ab pain. Review of systems is otherwise negative for all not mentioned in HPI.   PMHx Past Medical History:  Diagnosis Date  . Arthritis    BACK  . Asthma due to environmental allergies   . At risk for sleep apnea    STOP-BANG= 4       SENT TO PCP 04-05-2014  . Carpal tunnel syndrome, right   . GERD (gastroesophageal reflux disease)     PSHx Past Surgical History:  Procedure Laterality Date  . CARPAL TUNNEL RELEASE Right 04/06/2014   Procedure: RIGHT CARPAL TUNNEL RELEASE;  Surgeon: Bradly Bienenstock, MD;  Location: [facility];  Service: Orthopedics;  Laterality: Right;  . SHOULDER ARTHROSCOPY WITH ROTATOR CUFF REPAIR Left 2007  . WISDOM TOOTH EXTRACTION  2005    SocHx  reports that he has never smoked. His smokeless tobacco use includes snuff.  He reports current alcohol use. He reports that he does not use drugs.  No Known Allergies  FamHx History reviewed. No pertinent family history.  Prior to Admission medications   Medication Sig Start Date End Date Taking? Authorizing Provider  azithromycin (ZITHROMAX) 250 MG tablet Take 250 mg by mouth as directed. Take two tablets on the first day and then one tablet every day after. 12/05/19  Yes [provider]  benzonatate (TESSALON) 100 MG capsule Take 100 mg by mouth 3 (three) times daily. For 7 days 12/05/19 12/12/19 Yes [provider]  cetirizine (ZYRTEC) 10 MG tablet Take 10 mg by mouth daily as needed for allergies.    Yes [provider]  pantoprazole (PROTONIX) 40 MG tablet Take 40 mg by mouth daily. 11/30/19  Yes [provider]  rosuvastatin (CRESTOR) 20 MG tablet Take 20 mg by mouth at bedtime. 11/21/19  Yes [provider]  testosterone cypionate (DEPOTESTOSTERONE CYPIONATE) 200 MG/ML injection Inject 1 mL into the muscle every 14 (fourteen) days. 11/30/19  Yes [provider]  docusate sodium (COLACE) 100 MG capsule Take 1 capsule (100 mg total) by mouth 2 (two) times daily. Patient not taking: Reported on 12/09/2019 04/06/14   Bradly Bienenstock, MD  oxyCODONE-acetaminophen (ROXICET) 5-325 MG per tablet Take 1 tablet by mouth every 4 (four) hours as needed for severe pain. Patient not taking: Reported on 12/09/2019 04/06/14  Bradly Bienenstock, MD    Physical Exam: Vitals:   12/09/19 0945 12/09/19 1000 12/09/19 1015 12/09/19 1030  BP:  (!) 179/118  (!) 144/74  Pulse: 92 91 84 81  Resp: (!) 27 (!) 22 (!) 24 (!) 32  Temp:      SpO2: 92% 93% 93% 92%    General: 44 y.o. male resting in bed in NAD Eyes: PERRL, normal sclera ENMT: Nares patent w/o discharge, orophaynx clear, dentition normal, ears w/o discharge/lesions/ulcers Neck: Supple, trachea midline Cardiovascular: RRR, +S1, S2, no m/g/r, equal pulses  throughout Respiratory: b/l rhonchi at bases, b/l wheeze, tachypnea, increased WOB on 5L [place] GI: BS+, NDNT, no masses noted, no organomegaly noted MSK: No e/c/c Skin: No rashes, bruises, ulcerations noted Neuro: A&O x 3, no focal deficits Psyc: Appropriate interaction and affect, calm/cooperative  Labs on Admission: I have personally reviewed following labs and imaging studies  CBC: Recent Labs  Lab 12/09/19 0830  WBC 5.2  NEUTROABS 4.2  HGB 15.1  HCT 44.0  MCV 92.2  PLT 148*   Basic Metabolic Panel: Recent Labs  Lab 12/09/19 0830  NA 131*  K 4.5  CL 96*  CO2 25  GLUCOSE 118*  BUN 13  CREATININE 1.10  CALCIUM 7.9*   GFR: CrCl cannot be calculated (Unknown ideal weight.). Liver Function Tests: Recent Labs  Lab 12/09/19 0830  AST 248*  ALT 154*  ALKPHOS 50  BILITOT 0.4  PROT 6.8  ALBUMIN 3.7   No results for input(s): LIPASE, AMYLASE in the last 168 hours. No results for input(s): AMMONIA in the last 168 hours. Coagulation Profile: No results for input(s): INR, PROTIME in the last 168 hours. Cardiac Enzymes: No results for input(s): CKTOTAL, CKMB, CKMBINDEX, TROPONINI in the last 168 hours. BNP (last 3 results) No results for input(s): PROBNP in the last 8760 hours. HbA1C: No results for input(s): HGBA1C in the last 72 hours. CBG: No results for input(s): GLUCAP in the last 168 hours. Lipid Profile: Recent Labs    12/09/19 0830  TRIG 78   Thyroid Function Tests: No results for input(s): TSH, T4TOTAL, FREET4, T3FREE, THYROIDAB in the last 72 hours. Anemia Panel: Recent Labs    12/09/19 0830  FERRITIN 2,208*   Urine analysis: No results found for: COLORURINE, APPEARANCEUR, LABSPEC, PHURINE, GLUCOSEU, HGBUR, BILIRUBINUR, KETONESUR, PROTEINUR, UROBILINOGEN, NITRITE, LEUKOCYTESUR  Radiological Exams on Admission: DG Chest Port 1 View  Result Date: 12/09/2019 CLINICAL DATA:  Shortness of breath, COVID positive with worsening symptoms EXAM:  PORTABLE CHEST 1 VIEW COMPARISON:  December of 2006, no recent comparison is available. FINDINGS: Lung volumes are decreased. Cardiomediastinal contours accentuated by portable technique and low lung volumes. Patchy peripheral and mid chest to basilar opacities bilaterally. No lobar consolidation. No sign of pleural effusion. On limited assessment no acute skeletal process. IMPRESSION: Low volume chest with patchy peripheral and mid chest to basilar opacities bilaterally, compatible with pneumonia, pattern could be seen in the setting of COVID infection. Electronically Signed   By: Donzetta Kohut M.D.   On: 12/09/2019 09:00    EKG: Independently reviewed. NSR, no ST changes  Assessment/Plan Multifocal PNA COVID-19 Acute hypoxic respiratory failure     - admit to inpatient, med-surg as PUI     - will start COVID tx w/ solumedrol, remdes, baricitinab, inhalers, IS, Flutter     - wean O2 as able   Hyponatremia     - mild, monitor for now  HLD     - hold home crestor  as LFTs are up  Elevated LFTs     - check hepatitis panel; hold statin  Thrombocytopenia     - no evidence of bleed, follow  DVT prophylaxis: lovenox  Code Status: FULL  Family Communication: None at beside.  Consults called: None  Status is: Inpatient  Remains inpatient appropriate because:Inpatient level of care appropriate due to severity of illness   Dispo: The patient is from: Home              Anticipated d/c is to: Home              Anticipated d/c date is: 3 days              Patient currently is not medically stable to d/c.  Teddy Spike DO [facility]  If 7PM-7AM, please contact night-coverage [email or web address]  12/09/2019, 10:55 AM

## 2019-12-09 NOTE — Plan of Care (Signed)
Pt admitted with Covid 19 / pneumonia. Educated on the use of supplemental O2, cough & deep breathing, use of Incentive spirometry

## 2019-12-09 NOTE — ED Triage Notes (Addendum)
Pt presents with c/o cough and abdominal pain. Pt was diagnosed covid positive last Thursday. Pt denies any vomiting. Pt did not report this to RN, but wife who is with him reported that he became dizzy and fell this morning.

## 2019-12-10 DIAGNOSIS — E871 Hypo-osmolality and hyponatremia: Secondary | ICD-10-CM

## 2019-12-10 DIAGNOSIS — J9601 Acute respiratory failure with hypoxia: Secondary | ICD-10-CM | POA: Diagnosis not present

## 2019-12-10 DIAGNOSIS — U071 COVID-19: Secondary | ICD-10-CM | POA: Diagnosis not present

## 2019-12-10 DIAGNOSIS — R7989 Other specified abnormal findings of blood chemistry: Secondary | ICD-10-CM | POA: Diagnosis not present

## 2019-12-10 DIAGNOSIS — J1282 Pneumonia due to coronavirus disease 2019: Secondary | ICD-10-CM

## 2019-12-10 LAB — CBC WITH DIFFERENTIAL/PLATELET
Abs Immature Granulocytes: 0.21 10*3/uL — ABNORMAL HIGH (ref 0.00–0.07)
Basophils Absolute: 0 10*3/uL (ref 0.0–0.1)
Basophils Relative: 1 %
Eosinophils Absolute: 0 10*3/uL (ref 0.0–0.5)
Eosinophils Relative: 1 %
HCT: 43.8 % (ref 39.0–52.0)
Hemoglobin: 15 g/dL (ref 13.0–17.0)
Immature Granulocytes: 5 %
Lymphocytes Relative: 17 %
Lymphs Abs: 0.7 10*3/uL (ref 0.7–4.0)
MCH: 31.6 pg (ref 26.0–34.0)
MCHC: 34.2 g/dL (ref 30.0–36.0)
MCV: 92.2 fL (ref 80.0–100.0)
Monocytes Absolute: 0.2 10*3/uL (ref 0.1–1.0)
Monocytes Relative: 6 %
Neutro Abs: 2.8 10*3/uL (ref 1.7–7.7)
Neutrophils Relative %: 70 %
Platelets: 172 10*3/uL (ref 150–400)
RBC: 4.75 MIL/uL (ref 4.22–5.81)
RDW: 12.5 % (ref 11.5–15.5)
WBC: 4 10*3/uL (ref 4.0–10.5)
nRBC: 0 % (ref 0.0–0.2)

## 2019-12-10 LAB — COMPREHENSIVE METABOLIC PANEL
ALT: 151 U/L — ABNORMAL HIGH (ref 0–44)
AST: 220 U/L — ABNORMAL HIGH (ref 15–41)
Albumin: 3.5 g/dL (ref 3.5–5.0)
Alkaline Phosphatase: 50 U/L (ref 38–126)
Anion gap: 9 (ref 5–15)
BUN: 15 mg/dL (ref 6–20)
CO2: 25 mmol/L (ref 22–32)
Calcium: 7.9 mg/dL — ABNORMAL LOW (ref 8.9–10.3)
Chloride: 100 mmol/L (ref 98–111)
Creatinine, Ser: 0.91 mg/dL (ref 0.61–1.24)
GFR, Estimated: >60 mL/min (ref >60)
Glucose, Bld: 178 mg/dL — ABNORMAL HIGH (ref 70–99)
Potassium: 4.9 mmol/L (ref 3.5–5.1)
Sodium: 134 mmol/L — ABNORMAL LOW (ref 135–145)
Total Bilirubin: 0.7 mg/dL (ref 0.3–1.2)
Total Protein: 6.7 g/dL (ref 6.5–8.1)

## 2019-12-10 LAB — C-REACTIVE PROTEIN: CRP: 7.3 mg/dL — ABNORMAL HIGH (ref <1.0)

## 2019-12-10 LAB — PHOSPHORUS: Phosphorus: 3.1 mg/dL (ref 2.5–4.6)

## 2019-12-10 LAB — D-DIMER, QUANTITATIVE: D-Dimer, Quant: 1.38 ug/mL-FEU — ABNORMAL HIGH (ref 0.00–0.50)

## 2019-12-10 LAB — FERRITIN: Ferritin: 1856 ng/mL — ABNORMAL HIGH (ref 24–336)

## 2019-12-10 LAB — MAGNESIUM: Magnesium: 2.3 mg/dL (ref 1.7–2.4)

## 2019-12-10 MED ORDER — SALINE SPRAY 0.65 % NA SOLN
1.0000 | NASAL | Status: DC | PRN
  Filled 2019-12-10: qty 44

## 2019-12-10 MED ORDER — ENOXAPARIN SODIUM 80 MG/0.8ML ~~LOC~~ SOLN
70.0000 mg | SUBCUTANEOUS | Status: DC
  Administered 2019-12-10 – 2019-12-15 (×6): 70 mg via SUBCUTANEOUS
  Filled 2019-12-10 (×6): qty 0.8

## 2019-12-10 MED ORDER — ALUM & MAG HYDROXIDE-SIMETH 200-200-20 MG/5ML PO SUSP
30.0000 mL | ORAL | Status: DC | PRN
  Administered 2019-12-10: 30 mL via ORAL
  Filled 2019-12-10 (×2): qty 30

## 2019-12-10 NOTE — Progress Notes (Signed)
[facility] PROGRESS NOTE    Progress Note  Kenneth [name]  HKV:[id number] DOB: [birth year] DOA: 12/09/2019 PCP: Eartha Inch, MD     Brief Narrative:   Kenneth [name] is an 44 y.o. male past medical history of hyperlipidemia, who was on vaccinated for COVID-19 presents with fever dyspnea cough that started about 10 days prior to admission at home test at home which was positive came into the ED chest x-ray showed multi focal pneumonia with a positive SARS-CoV-2 PCR.  Assessment/Plan:   Acute respiratory failure with hypoxia due to pneumonia due toCOVID-19: Started on IV steroids, remdesivir and baricitinib. He required 5 L of oxygen to keep saturations greater than 89%. Has defervesced, with no leukocytosis. Inflammatory markers are significantly elevated. Continue to monitor strict I's and O's.  Continue Lovenox for DVT prophylaxis.  Elevated LFTs: With a normal alkaline phosphatase and bilirubin, likely due to COVID-19.  Continue to monitor closely.  Hypovolemic hyponatremia: Resolving with IV fluid hydration.  Hyperlipidemia: Holding statins due to elevated LFTs.  Thrombocytopenia: Resolved.   DVT prophylaxis: lovenox Family Communication:none Status is: Inpatient  Remains inpatient appropriate because:Hemodynamically unstable   Dispo: The patient is from: Home              Anticipated d/c is to: Home              Anticipated d/c date is: > 3 days              Patient currently is not medically stable to d/c.        Code Status:     Code Status Orders  (From admission, onward)         Start     Ordered   12/09/19 1159  Full code  Continuous        12/09/19 1158        Code Status History    This patient has a current code status but no historical code status.   Advance Care Planning Activity        IV Access:    Peripheral IV   Procedures and diagnostic studies:   DG Chest Port 1 View  Result Date:  12/09/2019 CLINICAL DATA:  Shortness of breath, COVID positive with worsening symptoms EXAM: PORTABLE CHEST 1 VIEW COMPARISON:  December of 2006, no recent comparison is available. FINDINGS: Lung volumes are decreased. Cardiomediastinal contours accentuated by portable technique and low lung volumes. Patchy peripheral and mid chest to basilar opacities bilaterally. No lobar consolidation. No sign of pleural effusion. On limited assessment no acute skeletal process. IMPRESSION: Low volume chest with patchy peripheral and mid chest to basilar opacities bilaterally, compatible with pneumonia, pattern could be seen in the setting of COVID infection. Electronically Signed   By: Donzetta Kohut M.D.   On: 12/09/2019 09:00     Medical Consultants:    None.  Anti-Infectives:   Remdesivir  Subjective:    Kenneth [name] relates his breathing is about the same as it was yesterday.  Objective:    Vitals:   12/09/19 2246 12/10/19 0245 12/10/19 0250 12/10/19 0629  BP: 135/84 (!) 122/93  139/80  Pulse: 73 86  69  Resp: 16 20  20   Temp: 98.6 F (37 C) 98.4 F (36.9 C)  98.4 F (36.9 C)  TempSrc: Oral Oral    SpO2: 93% (!) 88% 92% (!) 89%  Weight:      Height:       SpO2: (!) 89 %  O2 Flow Rate (L/min): 5 L/min   Intake/Output Summary (Last 24 hours) at 12/10/2019 0654 Last data filed at 12/09/2019 2000 Gross per 24 hour  Intake 240 ml  Output --  Net 240 ml   Filed Weights   12/09/19 1248 12/09/19 1335  Weight: (!) 149.7 kg (!) 147.4 kg    Exam: General exam: In no acute distress. Respiratory system: Good air movement and clear to auscultation. Cardiovascular system: S1 & S2 heard, RRR. No JVD. Gastrointestinal system: Abdomen is nondistended, soft and nontender.  Extremities: No pedal edema. Skin: No rashes, lesions or ulcers  Data Reviewed:    Labs: Basic Metabolic Panel: Recent Labs  Lab 12/09/19 0830 12/10/19 0442  NA 131* 134*  K 4.5 4.9  CL 96* 100   CO2 25 25  GLUCOSE 118* 178*  BUN 13 15  CREATININE 1.10 0.91  CALCIUM 7.9* 7.9*  MG  --  2.3  PHOS  --  3.1   GFR Estimated Creatinine Clearance: 157 mL/min (by C-G formula based on SCr of 0.91 mg/dL). Liver Function Tests: Recent Labs  Lab 12/09/19 0830 12/10/19 0442  AST 248* 220*  ALT 154* 151*  ALKPHOS 50 50  BILITOT 0.4 0.7  PROT 6.8 6.7  ALBUMIN 3.7 3.5   No results for input(s): LIPASE, AMYLASE in the last 168 hours. No results for input(s): AMMONIA in the last 168 hours. Coagulation profile No results for input(s): INR, PROTIME in the last 168 hours. COVID-19 Labs  Recent Labs    12/09/19 0830 12/10/19 0442  DDIMER 1.99* 1.38*  FERRITIN 2,208* 1,856*  LDH 764*  --   CRP 7.8* 7.3*    Lab Results  Component Value Date   SARSCOV2NAA POSITIVE (A) 12/09/2019    CBC: Recent Labs  Lab 12/09/19 0830 12/10/19 0442  WBC 5.2 4.0  NEUTROABS 4.2 2.8  HGB 15.1 15.0  HCT 44.0 43.8  MCV 92.2 92.2  PLT 148* 172   Cardiac Enzymes: No results for input(s): CKTOTAL, CKMB, CKMBINDEX, TROPONINI in the last 168 hours. BNP (last 3 results) No results for input(s): PROBNP in the last 8760 hours. CBG: No results for input(s): GLUCAP in the last 168 hours. D-Dimer: Recent Labs    12/09/19 0830 12/10/19 0442  DDIMER 1.99* 1.38*   Hgb A1c: No results for input(s): HGBA1C in the last 72 hours. Lipid Profile: Recent Labs    12/09/19 0830  TRIG 78   Thyroid function studies: No results for input(s): TSH, T4TOTAL, T3FREE, THYROIDAB in the last 72 hours.  Invalid input(s): FREET3 Anemia work up: Recent Labs    12/09/19 0830 12/10/19 0442  FERRITIN 2,208* 1,856*   Sepsis Labs: Recent Labs  Lab 12/09/19 0830 12/10/19 0442  PROCALCITON 0.39  --   WBC 5.2 4.0  LATICACIDVEN 0.9  --    Microbiology Recent Results (from the past 240 hour(s))  Blood Culture (routine x 2)     Status: None (Preliminary result)   Collection Time: 12/09/19  8:08 AM    Specimen: BLOOD  Result Value Ref Range Status   Specimen Description   Final    BLOOD RIGHT ANTECUBITAL Performed at [facility], [address] [place], [place] [address]    Special Requests   Final    BOTTLES DRAWN AEROBIC AND ANAEROBIC Blood Culture adequate volume Performed at [facility], [address] [place], [place] [address]    Culture   Final    NO GROWTH <12 HOURS Performed at  [facility], [address] [place], [place] [address]    Report Status PENDING  Incomplete  Blood Culture (routine x 2)     Status: None (Preliminary result)   Collection Time: 12/09/19  8:13 AM   Specimen: BLOOD  Result Value Ref Range Status   Specimen Description   Final    BLOOD LEFT ANTECUBITAL Performed at [facility], [address] [place], [place] [address]    Special Requests   Final    BOTTLES DRAWN AEROBIC AND ANAEROBIC Blood Culture results may not be optimal due to an excessive volume of blood received in culture bottles Performed at [facility], [address] [place], [place] [address]    Culture   Final    NO GROWTH <12 HOURS Performed at [facility], [address], [place], [place] [address]    Report Status PENDING  Incomplete  Resp Panel by RT-PCR (Flu A&B, Covid) Nasopharyngeal Swab     Status: Abnormal   Collection Time: 12/09/19  8:59 AM   Specimen: Nasopharyngeal Swab; Nasopharyngeal(NP) swabs in vial transport medium  Result Value Ref Range Status   SARS Coronavirus 2 by RT PCR POSITIVE (A) NEGATIVE Final    Comment: CRITICAL RESULT CALLED TO, READ BACK BY AND VERIFIED WITH: SIMSON,C @ 1412 12/09/2019 PARSONL (NOTE) SARS-CoV-2 target nucleic acids are DETECTED.  The SARS-CoV-2 RNA is generally detectable in upper respiratory specimens during the acute phase of infection. Positive results are indicative of the presence of the identified virus, but do not rule out  bacterial infection or co-infection with other pathogens not detected by the test. Clinical correlation with patient history and other diagnostic information is necessary to determine patient infection status. The expected result is Negative.  Fact Sheet for Patients: BloggerCourse.com  Fact Sheet for Healthcare Providers: SeriousBroker.it  This test is not yet approved or cleared by the [place] FDA and  has been authorized for detection and/or diagnosis of SARS-CoV-2 by FDA under an Emergency Use Authorization (EUA).  This EUA will remain in effect (meaning this t est can be used) for the duration of  the COVID-19 declaration under Section 564(b)(1) of the Act, 21 U.S.C. section 360bbb-3(b)(1), unless the authorization is terminated or revoked sooner.     Influenza A by PCR NEGATIVE NEGATIVE Final   Influenza B by PCR NEGATIVE NEGATIVE Final    Comment: (NOTE) The Xpert Xpress SARS-CoV-2/FLU/RSV plus assay is intended as an aid in the diagnosis of influenza from Nasopharyngeal swab specimens and should not be used as a sole basis for treatment. Nasal washings and aspirates are unacceptable for Xpert Xpress SARS-CoV-2/FLU/RSV testing.  Fact Sheet for Patients: BloggerCourse.com  Fact Sheet for Healthcare Providers: SeriousBroker.it  This test is not yet approved or cleared by the [place] FDA and has been authorized for detection and/or diagnosis of SARS-CoV-2 by FDA under an Emergency Use Authorization (EUA). This EUA will remain in effect (meaning this test can be used) for the duration of the COVID-19 declaration under Section 564(b)(1) of the Act, 21 U.S.C. section 360bbb-3(b)(1), unless the authorization is terminated or revoked.  Performed at [facility] [address], [place], [place] [address]      Medications:   . vitamin C  500  mg Oral Daily  . baricitinib  4 mg Oral Daily  . enoxaparin (LOVENOX) injection  40 mg Subcutaneous Q24H  . Ipratropium-Albuterol  1 puff Inhalation Q6H  . pantoprazole  40 mg Oral Daily  . [START ON 12/12/2019] predniSONE  50 mg Oral Daily  . zinc sulfate  220 mg Oral Daily   Continuous Infusions: . methylPREDNISolone (SOLU-MEDROL) injection Stopped (12/10/19 0232)  . remdesivir 100 mg in NS 100 mL        LOS: 1 day   Marinda Elk  [facility]  12/10/2019, 6:54 AM

## 2019-12-11 DIAGNOSIS — J9601 Acute respiratory failure with hypoxia: Secondary | ICD-10-CM | POA: Diagnosis not present

## 2019-12-11 DIAGNOSIS — U071 COVID-19: Secondary | ICD-10-CM | POA: Diagnosis not present

## 2019-12-11 DIAGNOSIS — E871 Hypo-osmolality and hyponatremia: Secondary | ICD-10-CM | POA: Diagnosis not present

## 2019-12-11 DIAGNOSIS — R7989 Other specified abnormal findings of blood chemistry: Secondary | ICD-10-CM | POA: Diagnosis not present

## 2019-12-11 LAB — CBC WITH DIFFERENTIAL/PLATELET
Abs Immature Granulocytes: 0.26 10*3/uL — ABNORMAL HIGH (ref 0.00–0.07)
Basophils Absolute: 0 10*3/uL (ref 0.0–0.1)
Basophils Relative: 0 %
Eosinophils Absolute: 0 10*3/uL (ref 0.0–0.5)
Eosinophils Relative: 0 %
HCT: 42.7 % (ref 39.0–52.0)
Hemoglobin: 14.7 g/dL (ref 13.0–17.0)
Immature Granulocytes: 3 %
Lymphocytes Relative: 12 %
Lymphs Abs: 1 10*3/uL (ref 0.7–4.0)
MCH: 31.7 pg (ref 26.0–34.0)
MCHC: 34.4 g/dL (ref 30.0–36.0)
MCV: 92.2 fL (ref 80.0–100.0)
Monocytes Absolute: 0.6 10*3/uL (ref 0.1–1.0)
Monocytes Relative: 7 %
Neutro Abs: 7 10*3/uL (ref 1.7–7.7)
Neutrophils Relative %: 78 %
Platelets: 183 10*3/uL (ref 150–400)
RBC: 4.63 MIL/uL (ref 4.22–5.81)
RDW: 12.4 % (ref 11.5–15.5)
WBC: 8.9 10*3/uL (ref 4.0–10.5)
nRBC: 0 % (ref 0.0–0.2)

## 2019-12-11 LAB — D-DIMER, QUANTITATIVE: D-Dimer, Quant: 0.62 ug/mL-FEU — ABNORMAL HIGH (ref 0.00–0.50)

## 2019-12-11 LAB — COMPREHENSIVE METABOLIC PANEL
ALT: 128 U/L — ABNORMAL HIGH (ref 0–44)
AST: 148 U/L — ABNORMAL HIGH (ref 15–41)
Albumin: 3.4 g/dL — ABNORMAL LOW (ref 3.5–5.0)
Alkaline Phosphatase: 45 U/L (ref 38–126)
Anion gap: 9 (ref 5–15)
BUN: 17 mg/dL (ref 6–20)
CO2: 27 mmol/L (ref 22–32)
Calcium: 8.1 mg/dL — ABNORMAL LOW (ref 8.9–10.3)
Chloride: 98 mmol/L (ref 98–111)
Creatinine, Ser: 0.88 mg/dL (ref 0.61–1.24)
GFR, Estimated: >60 mL/min (ref >60)
Glucose, Bld: 191 mg/dL — ABNORMAL HIGH (ref 70–99)
Potassium: 5 mmol/L (ref 3.5–5.1)
Sodium: 134 mmol/L — ABNORMAL LOW (ref 135–145)
Total Bilirubin: 0.6 mg/dL (ref 0.3–1.2)
Total Protein: 6.7 g/dL (ref 6.5–8.1)

## 2019-12-11 LAB — FERRITIN: Ferritin: 1747 ng/mL — ABNORMAL HIGH (ref 24–336)

## 2019-12-11 LAB — GLUCOSE, CAPILLARY
Glucose-Capillary: 190 mg/dL — ABNORMAL HIGH (ref 70–99)
Glucose-Capillary: 208 mg/dL — ABNORMAL HIGH (ref 70–99)

## 2019-12-11 LAB — MAGNESIUM: Magnesium: 2.4 mg/dL (ref 1.7–2.4)

## 2019-12-11 LAB — PHOSPHORUS: Phosphorus: 3.6 mg/dL (ref 2.5–4.6)

## 2019-12-11 LAB — C-REACTIVE PROTEIN: CRP: 2.4 mg/dL — ABNORMAL HIGH (ref <1.0)

## 2019-12-11 MED ORDER — METHYLPREDNISOLONE SODIUM SUCC 125 MG IJ SOLR
80.0000 mg | Freq: Two times a day (BID) | INTRAMUSCULAR | Status: DC
  Administered 2019-12-11 – 2019-12-16 (×10): 80 mg via INTRAVENOUS
  Filled 2019-12-11 (×10): qty 2

## 2019-12-11 MED ORDER — INSULIN ASPART 100 UNIT/ML ~~LOC~~ SOLN
0.0000 [IU] | Freq: Three times a day (TID) | SUBCUTANEOUS | Status: DC
  Administered 2019-12-11 – 2019-12-12 (×4): 3 [IU] via SUBCUTANEOUS
  Administered 2019-12-13: 5 [IU] via SUBCUTANEOUS
  Administered 2019-12-14 (×3): 3 [IU] via SUBCUTANEOUS
  Administered 2019-12-15: 2 [IU] via SUBCUTANEOUS
  Administered 2019-12-15 – 2019-12-16 (×3): 3 [IU] via SUBCUTANEOUS

## 2019-12-11 MED ORDER — LINAGLIPTIN 5 MG PO TABS
5.0000 mg | ORAL_TABLET | Freq: Every day | ORAL | Status: DC
  Administered 2019-12-11 – 2019-12-16 (×6): 5 mg via ORAL
  Filled 2019-12-11 (×6): qty 1

## 2019-12-11 MED ORDER — INSULIN ASPART 100 UNIT/ML ~~LOC~~ SOLN
0.0000 [IU] | Freq: Every day | SUBCUTANEOUS | Status: DC
  Administered 2019-12-11 – 2019-12-12 (×2): 2 [IU] via SUBCUTANEOUS

## 2019-12-11 MED ORDER — PHENOL 1.4 % MT LIQD
1.0000 | OROMUCOSAL | Status: DC | PRN
  Filled 2019-12-11: qty 177

## 2019-12-11 NOTE — Progress Notes (Signed)
PROGRESS NOTE  Kenneth [name]  FUX:[id number] DOB: 11/24/1975 DOA: 12/09/2019 PCP: Eartha Inch, MD   Brief Narrative: Kenneth [name] is a 44 y.o. male with a history of obesity, OSA, asthma, HLD who was diagnosed with covid-19 with a home test ~11/10 and presented to the ED 11/19 with fever, cough, and worsening shortness of breath. CXR demonstrated multifocal opacities, and he was hypoxic. Remdesivir, steroids, and baricitinib were started on admission for acute hypoxic respiratory failure due to covid-19 pneumonia.   Assessment & Plan: Active Problems:   Multifocal pneumonia   Pneumonia due to COVID-19 virus   Acute respiratory failure with hypoxia (HCC)   Hyponatremia   Elevated LFTs  Acute hypoxemic respiratory failure due to covid-19 pneumonia: SARS-CoV-2 home Ag test reportedly positive on ~11/10. Confirmed positive PCR 11/19. Worsening hypoxia 5L > 8L over past 24 hours. D-dimer down, CRP down.  - Continue remdesivir x5 days (11/19 - 11/23) - Continue steroids  - Continue baricitinib, started 11/19 - Encourage OOB, IS, FV, and awake proning if able - Continue airborne, contact precautions for 21 days from positive testing. - Monitor CMP and inflammatory markers - Enoxaparin prophylactic dose.   OSA, asthma: No exacerbation. - CPAP qHS, bronchodilators.  Hyperglycemia:  - Start SSI, linagliptin - Check HbA1c  LFT elevation: No cholestasis, likely related to viral syndrome. Hepatitis panel negative. - Monitor while on baricitinib/remdesivir as above, improving.  Hypovolemic hyponatremia: Improved.  HLD:  - Hold statin while LFTs elevated  Thrombocytopenia: Likely due to viral illness.  - Resolved.  Morbid obesity: Estimated body mass index is 41.73 kg/m as calculated from the following:   Height as of this encounter: 6\' 2"  (1.88 m).   Weight as of this encounter: 147.4 kg.  DVT prophylaxis: Lovenox Code Status: Full Family Communication: None  at bedside Disposition Plan:  Status is: Inpatient  Remains inpatient appropriate because:Inpatient level of care appropriate due to severity of illness  Dispo: The patient is from: Home              Anticipated d/c is to: Home              Anticipated d/c date is: > 3 days              Patient currently is not medically stable to d/c.  Consultants:   None  Procedures:   None  Antimicrobials:  Remdesivir   Subjective: Shortness of breath limits movement, but able to get up to chair with moderate dyspnea that is constant and gradually improves with rest and oxygen. No chest pain or leg swelling.  Objective: Vitals:   12/10/19 2025 12/10/19 2300 12/11/19 0436 12/11/19 1341  BP: 133/85  129/80 126/76  Pulse: 78  68 73  Resp: 20 (!) 21 20 18   Temp: 98 F (36.7 C)  97.6 F (36.4 C) 98.4 F (36.9 C)  TempSrc: Oral  Oral Oral  SpO2: 93%  (!) 87% 99%  Weight:      Height:        Intake/Output Summary (Last 24 hours) at 12/11/2019 1434 Last data filed at 12/10/2019 2300 Gross per 24 hour  Intake 338.4 ml  Output 1100 ml  Net -761.6 ml   Filed Weights   12/09/19 1248 12/09/19 1335  Weight: (!) 149.7 kg (!) 147.4 kg    Gen: Ill-appearing male in no distress Pulm: Non-labored breathing HFNC, crackles prominent bilaterally.  CV: Regular rate and rhythm. No murmur, rub, or gallop. No JVD,  no pitting pedal edema. GI: Abdomen soft, non-tender, non-distended, with normoactive bowel sounds. No organomegaly or masses felt. Ext: Warm, no deformities Skin: No rashes, lesions or ulcers Neuro: Alert and oriented. No focal neurological deficits. Psych: Judgement and insight appear normal. Mood & affect appropriate.   Data Reviewed: I have personally reviewed following labs and imaging studies  CBC: Recent Labs  Lab 12/09/19 0830 12/10/19 0442 12/11/19 0334  WBC 5.2 4.0 8.9  NEUTROABS 4.2 2.8 7.0  HGB 15.1 15.0 14.7  HCT 44.0 43.8 42.7  MCV 92.2 92.2 92.2  PLT 148*  172 183   Basic Metabolic Panel: Recent Labs  Lab 12/09/19 0830 12/10/19 0442 12/11/19 0334  NA 131* 134* 134*  K 4.5 4.9 5.0  CL 96* 100 98  CO2 25 25 27   GLUCOSE 118* 178* 191*  BUN 13 15 17   CREATININE 1.10 0.91 0.88  CALCIUM 7.9* 7.9* 8.1*  MG  --  2.3 2.4  PHOS  --  3.1 3.6   GFR: Estimated Creatinine Clearance: 162.4 mL/min (by C-G formula based on SCr of 0.88 mg/dL). Liver Function Tests: Recent Labs  Lab 12/09/19 0830 12/10/19 0442 12/11/19 0334  AST 248* 220* 148*  ALT 154* 151* 128*  ALKPHOS 50 50 45  BILITOT 0.4 0.7 0.6  PROT 6.8 6.7 6.7  ALBUMIN 3.7 3.5 3.4*   No results for input(s): LIPASE, AMYLASE in the last 168 hours. No results for input(s): AMMONIA in the last 168 hours. Coagulation Profile: No results for input(s): INR, PROTIME in the last 168 hours. Cardiac Enzymes: No results for input(s): CKTOTAL, CKMB, CKMBINDEX, TROPONINI in the last 168 hours. BNP (last 3 results) No results for input(s): PROBNP in the last 8760 hours. HbA1C: No results for input(s): HGBA1C in the last 72 hours. CBG: No results for input(s): GLUCAP in the last 168 hours. Lipid Profile: Recent Labs    12/09/19 0830  TRIG 78   Thyroid Function Tests: No results for input(s): TSH, T4TOTAL, FREET4, T3FREE, THYROIDAB in the last 72 hours. Anemia Panel: Recent Labs    12/10/19 0442 12/11/19 0334  FERRITIN 1,856* 1,747*   Urine analysis: No results found for: COLORURINE, APPEARANCEUR, LABSPEC, PHURINE, GLUCOSEU, HGBUR, BILIRUBINUR, KETONESUR, PROTEINUR, UROBILINOGEN, NITRITE, LEUKOCYTESUR Recent Results (from the past 240 hour(s))  Blood Culture (routine x 2)     Status: None (Preliminary result)   Collection Time: 12/09/19  8:08 AM   Specimen: BLOOD  Result Value Ref Range Status   Specimen Description   Final    BLOOD RIGHT ANTECUBITAL Performed at [facility], [address], [place], [place]    Special Requests   Final     BOTTLES DRAWN AEROBIC AND ANAEROBIC Blood Culture adequate volume Performed at [facility], [address], [place], [place]    Culture   Final    NO GROWTH 2 DAYS Performed at [facility], [address], [place], [address] [place]    Report Status PENDING  Incomplete  Blood Culture (routine x 2)     Status: None (Preliminary result)   Collection Time: 12/09/19  8:13 AM   Specimen: BLOOD  Result Value Ref Range Status   Specimen Description   Final    BLOOD LEFT ANTECUBITAL Performed at [facility], [address] [place], [place]    Special Requests   Final    BOTTLES DRAWN AEROBIC AND ANAEROBIC Blood Culture results may not be optimal due  to an excessive volume of blood received in culture bottles Performed at [facility], [address] [place], [place] [address]    Culture   Final    NO GROWTH 2 DAYS Performed at [facility], [address], [place], [place] [address]    Report Status PENDING  Incomplete  Resp Panel by RT-PCR (Flu A&B, Covid) Nasopharyngeal Swab     Status: Abnormal   Collection Time: 12/09/19  8:59 AM   Specimen: Nasopharyngeal Swab; Nasopharyngeal(NP) swabs in vial transport medium  Result Value Ref Range Status   SARS Coronavirus 2 by RT PCR POSITIVE (A) NEGATIVE Final    Comment: CRITICAL RESULT CALLED TO, READ BACK BY AND VERIFIED WITH: SIMSON,C @ 1412 12/09/2019 PARSONL (NOTE) SARS-CoV-2 target nucleic acids are DETECTED.  The SARS-CoV-2 RNA is generally detectable in upper respiratory specimens during the acute phase of infection. Positive results are indicative of the presence of the identified virus, but do not rule out bacterial infection or co-infection with other pathogens not detected by the test. Clinical correlation with patient history and other diagnostic information is necessary to determine patient infection status. The expected result is  Negative.  Fact Sheet for Patients: BloggerCourse.com  Fact Sheet for Healthcare Providers: SeriousBroker.it  This test is not yet approved or cleared by the [place] FDA and  has been authorized for detection and/or diagnosis of SARS-CoV-2 by FDA under an Emergency Use Authorization (EUA).  This EUA will remain in effect (meaning this t est can be used) for the duration of  the COVID-19 declaration under Section 564(b)(1) of the Act, 21 U.S.C. section 360bbb-3(b)(1), unless the authorization is terminated or revoked sooner.     Influenza A by PCR NEGATIVE NEGATIVE Final   Influenza B by PCR NEGATIVE NEGATIVE Final    Comment: (NOTE) The Xpert Xpress SARS-CoV-2/FLU/RSV plus assay is intended as an aid in the diagnosis of influenza from Nasopharyngeal swab specimens and should not be used as a sole basis for treatment. Nasal washings and aspirates are unacceptable for Xpert Xpress SARS-CoV-2/FLU/RSV testing.  Fact Sheet for Patients: BloggerCourse.com  Fact Sheet for Healthcare Providers: SeriousBroker.it  This test is not yet approved or cleared by the [place] FDA and has been authorized for detection and/or diagnosis of SARS-CoV-2 by FDA under an Emergency Use Authorization (EUA). This EUA will remain in effect (meaning this test can be used) for the duration of the COVID-19 declaration under Section 564(b)(1) of the Act, 21 U.S.C. section 360bbb-3(b)(1), unless the authorization is terminated or revoked.  Performed at [facility], [address] [place], [place] [address]       Radiology Studies: No results found.  Scheduled Meds: . vitamin C  500 mg Oral Daily  . baricitinib  4 mg Oral Daily  . enoxaparin (LOVENOX) injection  70 mg Subcutaneous Q24H  . Ipratropium-Albuterol  1 puff Inhalation Q6H  . pantoprazole  40 mg Oral Daily  .  [START ON 12/12/2019] predniSONE  50 mg Oral Daily  . zinc sulfate  220 mg Oral Daily   Continuous Infusions: . methylPREDNISolone (SOLU-MEDROL) injection 150 mg (12/10/19 2103)  . remdesivir 100 mg in NS 100 mL 100 mg (12/11/19 0952)     LOS: 2 days   Time spent: 35 minutes.  Tyrone Nine, MD [facility] [email or web address] 12/11/2019, 2:34 PM

## 2019-12-11 NOTE — Progress Notes (Signed)
Pt is unable to tolerate hospital provided CPAP and unable to sleep in hosp bed. Laying on couch/bench, has been in the recliner tolerating  O2 @ 8L HFNC any exertion or use of IS causes a coughing episode and desats to low 80s with a slow recovery.Marland Kitchen

## 2019-12-12 DIAGNOSIS — E871 Hypo-osmolality and hyponatremia: Secondary | ICD-10-CM | POA: Diagnosis not present

## 2019-12-12 DIAGNOSIS — J9601 Acute respiratory failure with hypoxia: Secondary | ICD-10-CM | POA: Diagnosis not present

## 2019-12-12 DIAGNOSIS — U071 COVID-19: Secondary | ICD-10-CM | POA: Diagnosis not present

## 2019-12-12 DIAGNOSIS — R7989 Other specified abnormal findings of blood chemistry: Secondary | ICD-10-CM | POA: Diagnosis not present

## 2019-12-12 LAB — C-REACTIVE PROTEIN: CRP: 1.1 mg/dL — ABNORMAL HIGH (ref <1.0)

## 2019-12-12 LAB — COMPREHENSIVE METABOLIC PANEL
ALT: 191 U/L — ABNORMAL HIGH (ref 0–44)
AST: 168 U/L — ABNORMAL HIGH (ref 15–41)
Albumin: 3.4 g/dL — ABNORMAL LOW (ref 3.5–5.0)
Alkaline Phosphatase: 46 U/L (ref 38–126)
Anion gap: 9 (ref 5–15)
BUN: 20 mg/dL (ref 6–20)
CO2: 27 mmol/L (ref 22–32)
Calcium: 8.1 mg/dL — ABNORMAL LOW (ref 8.9–10.3)
Chloride: 96 mmol/L — ABNORMAL LOW (ref 98–111)
Creatinine, Ser: 0.85 mg/dL (ref 0.61–1.24)
GFR, Estimated: >60 mL/min (ref >60)
Glucose, Bld: 181 mg/dL — ABNORMAL HIGH (ref 70–99)
Potassium: 5.1 mmol/L (ref 3.5–5.1)
Sodium: 132 mmol/L — ABNORMAL LOW (ref 135–145)
Total Bilirubin: 0.8 mg/dL (ref 0.3–1.2)
Total Protein: 6.7 g/dL (ref 6.5–8.1)

## 2019-12-12 LAB — HEMOGLOBIN A1C
Hgb A1c MFr Bld: 6.1 % — ABNORMAL HIGH (ref 4.8–5.6)
Mean Plasma Glucose: 128.37 mg/dL

## 2019-12-12 LAB — GLUCOSE, CAPILLARY
Glucose-Capillary: 174 mg/dL — ABNORMAL HIGH (ref 70–99)
Glucose-Capillary: 175 mg/dL — ABNORMAL HIGH (ref 70–99)
Glucose-Capillary: 183 mg/dL — ABNORMAL HIGH (ref 70–99)
Glucose-Capillary: 225 mg/dL — ABNORMAL HIGH (ref 70–99)

## 2019-12-12 LAB — D-DIMER, QUANTITATIVE: D-Dimer, Quant: 0.75 ug/mL-FEU — ABNORMAL HIGH (ref 0.00–0.50)

## 2019-12-12 MED ORDER — FUROSEMIDE 10 MG/ML IJ SOLN
40.0000 mg | Freq: Once | INTRAMUSCULAR | Status: AC
  Administered 2019-12-12: 40 mg via INTRAVENOUS
  Filled 2019-12-12: qty 4

## 2019-12-12 NOTE — Progress Notes (Signed)
PROGRESS NOTE  Kenneth [name]  IRW:[id number] DOB: Jun 14, 1975 DOA: 12/09/2019 PCP: Eartha Inch, MD   Brief Narrative: Kenneth [name] is a 44 y.o. male with a history of obesity, OSA, asthma, HLD who was diagnosed with covid-19 with a home test ~11/10 and presented to the ED 11/19 with fever, cough, and worsening shortness of breath. CXR demonstrated multifocal opacities, and he was hypoxic. Remdesivir, steroids, and baricitinib were started on admission for acute hypoxic respiratory failure due to covid-19 pneumonia.   Assessment & Plan: Active Problems:   Multifocal pneumonia   Pneumonia due to COVID-19 virus   Acute respiratory failure with hypoxia (HCC)   Hyponatremia   Elevated LFTs  Acute hypoxemic respiratory failure due to covid-19 pneumonia: SARS-CoV-2 home Ag test reportedly positive on ~11/10. Confirmed positive PCR 11/19.  D-dimer down, CRP down.  - Continue remdesivir x5 days (11/19 - 11/23) - Continue steroids. CRP improving, though hypoxia continues slow worsening. - Continue baricitinib, started 11/19 - Encourage OOB, IS, FV, and awake proning if able - Continue airborne, contact precautions for 21 days from positive testing. - Monitor CMP and inflammatory markers - Enoxaparin prophylactic dose.   Leg swelling:  - Trial lasix, monitor I/O.  OSA, asthma: No exacerbation. - CPAP qHS, bronchodilators.  Hyperglycemia, prediabetes: HbA1c 6.1%. - Started SSI, linagliptin. Will initiate low dose basal coverage if CBG above inpatient goal today.  LFT elevation: No cholestasis, likely related to viral syndrome. Hepatitis panel negative. Slightly worse on 11/22 - Monitor while on baricitinib/remdesivir as above. Currently low suspicion for DILI, though will monitor closely.   Hypovolemic hyponatremia: Monitor.   HLD:  - Hold statin while LFTs elevated  Thrombocytopenia: Likely due to viral illness.  - Resolved.  Morbid obesity: Estimated body mass  index is 40.69 kg/m as calculated from the following:   Height as of this encounter: 6\' 2"  (1.88 m).   Weight as of this encounter: 143.7 kg.  DVT prophylaxis: Lovenox Code Status: Full Family Communication: None at bedside Disposition Plan:  Status is: Inpatient  Remains inpatient appropriate because:Inpatient level of care appropriate due to severity of illness  Dispo: The patient is from: Home              Anticipated d/c is to: Home              Anticipated d/c date is: > 3 days              Patient currently is not medically stable to d/c.  Consultants:   None  Procedures:   None  Antimicrobials:  Remdesivir   Subjective: Still constantly, severely diffusely weak limiting mobility, but wants to get around. Sitting in chair consistently, notes leg swelling. No chest pain or bleeding. No abd pain/N/V/D reported.  Objective: Vitals:   12/11/19 2034 12/11/19 2106 12/12/19 0458 12/12/19 0536  BP:  125/70 127/77   Pulse: 76 72 72   Resp:  20 18   Temp:  98.3 F (36.8 C) 97.6 F (36.4 C)   TempSrc:  Oral Oral   SpO2: 96% 94% 93%   Weight:    (!) 143.7 kg  Height:        Intake/Output Summary (Last 24 hours) at 12/12/2019 1111 Last data filed at 12/12/2019 0500 Gross per 24 hour  Intake 185.95 ml  Output 1150 ml  Net -964.05 ml   Filed Weights   12/09/19 1248 12/09/19 1335 12/12/19 0536  Weight: (!) 149.7 kg (!) 147.4 kg 12/14/19)  143.7 kg   Gen: 44 y.o. male in no distress Pulm: Tachypneic on 12L HFNC, crackles bilaterally. CV: Regular rate and rhythm. No murmur, rub, or gallop. No JVD, trace nonpitting dependent edema. GI: Abdomen soft, non-tender, non-distended, with normoactive bowel sounds.  Ext: Warm, no deformities Skin: No rashes, lesions or ulcers on visualized skin. Neuro: Alert and oriented. No focal neurological deficits. Psych: Judgement and insight appear fair. Mood euthymic & affect congruent. Behavior is appropriate.    Data Reviewed: I have  personally reviewed following labs and imaging studies  CBC: Recent Labs  Lab 12/09/19 0830 12/10/19 0442 12/11/19 0334  WBC 5.2 4.0 8.9  NEUTROABS 4.2 2.8 7.0  HGB 15.1 15.0 14.7  HCT 44.0 43.8 42.7  MCV 92.2 92.2 92.2  PLT 148* 172 183   Basic Metabolic Panel: Recent Labs  Lab 12/09/19 0830 12/10/19 0442 12/11/19 0334 12/12/19 0432  NA 131* 134* 134* 132*  K 4.5 4.9 5.0 5.1  CL 96* 100 98 96*  CO2 25 25 27 27   GLUCOSE 118* 178* 191* 181*  BUN 13 15 17 20   CREATININE 1.10 0.91 0.88 0.85  CALCIUM 7.9* 7.9* 8.1* 8.1*  MG  --  2.3 2.4  --   PHOS  --  3.1 3.6  --    GFR: Estimated Creatinine Clearance: 165.8 mL/min (by C-G formula based on SCr of 0.85 mg/dL). Liver Function Tests: Recent Labs  Lab 12/09/19 0830 12/10/19 0442 12/11/19 0334 12/12/19 0432  AST 248* 220* 148* 168*  ALT 154* 151* 128* 191*  ALKPHOS 50 50 45 46  BILITOT 0.4 0.7 0.6 0.8  PROT 6.8 6.7 6.7 6.7  ALBUMIN 3.7 3.5 3.4* 3.4*   No results for input(s): LIPASE, AMYLASE in the last 168 hours. No results for input(s): AMMONIA in the last 168 hours. Coagulation Profile: No results for input(s): INR, PROTIME in the last 168 hours. Cardiac Enzymes: No results for input(s): CKTOTAL, CKMB, CKMBINDEX, TROPONINI in the last 168 hours. BNP (last 3 results) No results for input(s): PROBNP in the last 8760 hours. HbA1C: Recent Labs    12/12/19 0432  HGBA1C 6.1*   CBG: Recent Labs  Lab 12/11/19 1750 12/11/19 2102 12/12/19 0833  GLUCAP 190* 208* 174*   Lipid Profile: No results for input(s): CHOL, HDL, LDLCALC, TRIG, CHOLHDL, LDLDIRECT in the last 72 hours. Thyroid Function Tests: No results for input(s): TSH, T4TOTAL, FREET4, T3FREE, THYROIDAB in the last 72 hours. Anemia Panel: Recent Labs    12/10/19 0442 12/11/19 0334  FERRITIN 1,856* 1,747*   Urine analysis: No results found for: COLORURINE, APPEARANCEUR, LABSPEC, PHURINE, GLUCOSEU, HGBUR, BILIRUBINUR, KETONESUR, PROTEINUR,  UROBILINOGEN, NITRITE, LEUKOCYTESUR Recent Results (from the past 240 hour(s))  Blood Culture (routine x 2)     Status: None (Preliminary result)   Collection Time: 12/09/19  8:08 AM   Specimen: BLOOD  Result Value Ref Range Status   Specimen Description   Final    BLOOD RIGHT ANTECUBITAL Performed at [facility], [address] [place], [place]    Special Requests   Final    BOTTLES DRAWN AEROBIC AND ANAEROBIC Blood Culture adequate volume Performed at [facility], [address] [place], [place]    Culture   Final    NO GROWTH 3 DAYS Performed at [facility], [address] [place], [address] [place]    Report Status PENDING  Incomplete  Blood Culture (routine x 2)     Status: None (  Preliminary result)   Collection Time: 12/09/19  8:13 AM   Specimen: BLOOD  Result Value Ref Range Status   Specimen Description   Final    BLOOD LEFT ANTECUBITAL Performed at [facility], [address] [place], [place] [address]    Special Requests   Final    BOTTLES DRAWN AEROBIC AND ANAEROBIC Blood Culture results may not be optimal due to an excessive volume of blood received in culture bottles Performed at [facility], [address] [place], [place] [address]    Culture   Final    NO GROWTH 3 DAYS Performed at [facility], [address], [place], [place] [address]    Report Status PENDING  Incomplete  Resp Panel by RT-PCR (Flu A&B, Covid) Nasopharyngeal Swab     Status: Abnormal   Collection Time: 12/09/19  8:59 AM   Specimen: Nasopharyngeal Swab; Nasopharyngeal(NP) swabs in vial transport medium  Result Value Ref Range Status   SARS Coronavirus 2 by RT PCR POSITIVE (A) NEGATIVE Final    Comment: CRITICAL RESULT CALLED TO, READ BACK BY AND VERIFIED WITH: SIMSON,C @ 1412 12/09/2019 PARSONL (NOTE) SARS-CoV-2 target nucleic acids are DETECTED.  The SARS-CoV-2 RNA is  generally detectable in upper respiratory specimens during the acute phase of infection. Positive results are indicative of the presence of the identified virus, but do not rule out bacterial infection or co-infection with other pathogens not detected by the test. Clinical correlation with patient history and other diagnostic information is necessary to determine patient infection status. The expected result is Negative.  Fact Sheet for Patients: BloggerCourse.com  Fact Sheet for Healthcare Providers: SeriousBroker.it  This test is not yet approved or cleared by the [place] FDA and  has been authorized for detection and/or diagnosis of SARS-CoV-2 by FDA under an Emergency Use Authorization (EUA).  This EUA will remain in effect (meaning this t est can be used) for the duration of  the COVID-19 declaration under Section 564(b)(1) of the Act, 21 U.S.C. section 360bbb-3(b)(1), unless the authorization is terminated or revoked sooner.     Influenza A by PCR NEGATIVE NEGATIVE Final   Influenza B by PCR NEGATIVE NEGATIVE Final    Comment: (NOTE) The Xpert Xpress SARS-CoV-2/FLU/RSV plus assay is intended as an aid in the diagnosis of influenza from Nasopharyngeal swab specimens and should not be used as a sole basis for treatment. Nasal washings and aspirates are unacceptable for Xpert Xpress SARS-CoV-2/FLU/RSV testing.  Fact Sheet for Patients: BloggerCourse.com  Fact Sheet for Healthcare Providers: SeriousBroker.it  This test is not yet approved or cleared by the [place] FDA and has been authorized for detection and/or diagnosis of SARS-CoV-2 by FDA under an Emergency Use Authorization (EUA). This EUA will remain in effect (meaning this test can be used) for the duration of the COVID-19 declaration under Section 564(b)(1) of the Act, 21 U.S.C. section 360bbb-3(b)(1),  unless the authorization is terminated or revoked.  Performed at [facility], [address], [place], [place] [address]       Radiology Studies: No results found.  Scheduled Meds: . vitamin C  500 mg Oral Daily  . baricitinib  4 mg Oral Daily  . enoxaparin (LOVENOX) injection  70 mg Subcutaneous Q24H  . insulin aspart  0-15 Units Subcutaneous TID WC  . insulin aspart  0-5 Units Subcutaneous QHS  . Ipratropium-Albuterol  1 puff Inhalation Q6H  . linagliptin  5 mg Oral Daily  .  methylPREDNISolone (SOLU-MEDROL) injection  80 mg Intravenous Q12H  . pantoprazole  40 mg Oral Daily  . zinc sulfate  220 mg Oral Daily   Continuous Infusions: . remdesivir 100 mg in NS 100 mL 100 mg (12/12/19 0908)     LOS: 3 days   Time spent: 35 minutes.  Kenneth Nineyan B Zade Falkner, MD [facility] [email or web address] 12/12/2019, 11:11 AM

## 2019-12-12 NOTE — Progress Notes (Signed)
Pt. seen for CPAP/BiPAP again refused for this shift, remains in room, aware to notify if wanting to wear.

## 2019-12-13 ENCOUNTER — Inpatient Hospital Stay (HOSPITAL_COMMUNITY): Payer: 59

## 2019-12-13 DIAGNOSIS — J9601 Acute respiratory failure with hypoxia: Secondary | ICD-10-CM | POA: Diagnosis not present

## 2019-12-13 DIAGNOSIS — U071 COVID-19: Secondary | ICD-10-CM | POA: Diagnosis not present

## 2019-12-13 DIAGNOSIS — E871 Hypo-osmolality and hyponatremia: Secondary | ICD-10-CM | POA: Diagnosis not present

## 2019-12-13 DIAGNOSIS — R7989 Other specified abnormal findings of blood chemistry: Secondary | ICD-10-CM

## 2019-12-13 LAB — COMPREHENSIVE METABOLIC PANEL
ALT: 318 U/L — ABNORMAL HIGH (ref 0–44)
AST: 205 U/L — ABNORMAL HIGH (ref 15–41)
Albumin: 3.4 g/dL — ABNORMAL LOW (ref 3.5–5.0)
Alkaline Phosphatase: 43 U/L (ref 38–126)
Anion gap: 9 (ref 5–15)
BUN: 22 mg/dL — ABNORMAL HIGH (ref 6–20)
CO2: 27 mmol/L (ref 22–32)
Calcium: 7.9 mg/dL — ABNORMAL LOW (ref 8.9–10.3)
Chloride: 97 mmol/L — ABNORMAL LOW (ref 98–111)
Creatinine, Ser: 0.82 mg/dL (ref 0.61–1.24)
GFR, Estimated: >60 mL/min (ref >60)
Glucose, Bld: 181 mg/dL — ABNORMAL HIGH (ref 70–99)
Potassium: 4.8 mmol/L (ref 3.5–5.1)
Sodium: 133 mmol/L — ABNORMAL LOW (ref 135–145)
Total Bilirubin: 0.9 mg/dL (ref 0.3–1.2)
Total Protein: 6.6 g/dL (ref 6.5–8.1)

## 2019-12-13 LAB — D-DIMER, QUANTITATIVE: D-Dimer, Quant: 1.01 ug/mL-FEU — ABNORMAL HIGH (ref 0.00–0.50)

## 2019-12-13 LAB — C-REACTIVE PROTEIN: CRP: 0.6 mg/dL (ref <1.0)

## 2019-12-13 LAB — GLUCOSE, CAPILLARY
Glucose-Capillary: 170 mg/dL — ABNORMAL HIGH (ref 70–99)
Glucose-Capillary: 178 mg/dL — ABNORMAL HIGH (ref 70–99)
Glucose-Capillary: 228 mg/dL — ABNORMAL HIGH (ref 70–99)
Glucose-Capillary: 175 mg/dL — ABNORMAL HIGH (ref 70–99)

## 2019-12-13 MED ORDER — TRAZODONE HCL 50 MG PO TABS
50.0000 mg | ORAL_TABLET | Freq: Every day | ORAL | Status: DC
  Administered 2019-12-13 – 2019-12-15 (×3): 50 mg via ORAL
  Filled 2019-12-13 (×3): qty 1

## 2019-12-13 MED ORDER — NYSTATIN 100000 UNIT/ML MT SUSP
5.0000 mL | Freq: Four times a day (QID) | OROMUCOSAL | Status: DC
  Administered 2019-12-13 – 2019-12-16 (×13): 500000 [IU] via OROMUCOSAL
  Filled 2019-12-13 (×13): qty 5

## 2019-12-13 MED ORDER — FUROSEMIDE 10 MG/ML IJ SOLN
40.0000 mg | Freq: Every day | INTRAMUSCULAR | Status: DC
  Administered 2019-12-13 – 2019-12-16 (×4): 40 mg via INTRAVENOUS
  Filled 2019-12-13 (×4): qty 4

## 2019-12-13 NOTE — Progress Notes (Signed)
Pt refused CPAP qhs.  Machine remains in room and Pt encouraged to contact RT should he change his mind.

## 2019-12-13 NOTE — Progress Notes (Signed)
PROGRESS NOTE  Kenneth [name]  VEL:[id number] DOB: [birth year] DOA: 12/09/2019 PCP: Eartha Inch, MD   Brief Narrative: Kenneth [name] is a 44 y.o. male with a history of obesity, OSA, asthma, HLD who was diagnosed with covid-19 with a home test ~11/10 and presented to the ED 11/19 with fever, cough, and worsening shortness of breath. CXR demonstrated multifocal opacities, and he was hypoxic. Remdesivir, steroids, and baricitinib were started on admission for acute hypoxic respiratory failure due to covid-19 pneumonia.   Assessment & Plan: Active Problems:   Multifocal pneumonia   Pneumonia due to COVID-19 virus   Acute respiratory failure with hypoxia (HCC)   Hyponatremia   Elevated LFTs  Acute hypoxemic respiratory failure due to covid-19 pneumonia: SARS-CoV-2 home Ag test reportedly positive on ~11/10. Confirmed positive PCR 11/19.  D-dimer down, CRP down.  - With LFT rising, will stop remdesivir after 4th dose yesterday (received 11/19 - 11/22) - Continue steroids. CRP improving, though hypoxia severe. - Continue baricitinib, started 11/19. Will continue monitoring LFTs with work up below. If continues rise, will have to stop this.  - Encourage OOB, IS, FV, and awake proning if able - Continue airborne, contact precautions for 21 days from positive testing. - Monitor CMP and inflammatory markers - Enoxaparin prophylactic dose.   LFT elevation: No cholestasis, likely related to viral syndrome. Hepatitis panel negative. Exam benign. - Continues slow worsening, check RUQ U/S, stop remdesivir for now. Monitor again in AM, may need to hold baricitinib as well. Remains low suspicion for DILI at these levels, though will monitor closely.   Leg swelling:  - Repeat lasix today, no real change clinically and d-dimer >1. Will r/o DVT w/venous U/S. BUN slightly higher, Cr stable, UOP was 2.1L / 24 hrs.  Oropharyngeal candidiasis: Putative Dx. - Nystatin QID  OSA, asthma: No  exacerbation at this time. - CPAP qHS, bronchodilators.  Hyperglycemia, prediabetes: HbA1c 6.1%. - Started SSI, linagliptin. Will initiate low dose basal coverage if CBG above inpatient goal today.  Hypovolemic hyponatremia: Monitor.   HLD:  - Hold statin while LFTs elevated  Thrombocytopenia: Likely due to viral illness.  - Resolved.  Insomnia:  - Trazodone  Morbid obesity: Estimated body mass index is 40.69 kg/m as calculated from the following:   Height as of this encounter: 6\' 2"  (1.88 m).   Weight as of this encounter: 143.7 kg.  DVT prophylaxis: Lovenox Code Status: Full Family Communication: None at bedside; declined offer to call family today Disposition Plan:  Status is: Inpatient  Remains inpatient appropriate because:Inpatient level of care appropriate due to severity of illness  Dispo: The patient is from: Home              Anticipated d/c is to: Home              Anticipated d/c date is: > 3 days              Patient currently is not medically stable to d/c.  Consultants:   None  Procedures:   None  Antimicrobials:  Remdesivir   Subjective: Miserable being here, says shortness of breath is about the same, mostly with exertion, denies orthopnea. No chest pain, abd pain, N/V/D. Had extension tubing with HFNC which has been taken away limiting mobility but improving oxygen delivery. Not sleeping at all. No change to leg swelling.   Objective: Vitals:   12/12/19 0536 12/12/19 1339 12/12/19 2000 12/13/19 0400  BP:  (!) 141/100 137/89 112/73  Pulse:  68 69 78  Resp:  20 (!) 21 20  Temp:  98.3 F (36.8 C) 97.7 F (36.5 C) 97.8 F (36.6 C)  TempSrc:  Oral Oral Oral  SpO2:  92% 93% 95%  Weight: (!) 143.7 kg     Height:        Intake/Output Summary (Last 24 hours) at 12/13/2019 0824 Last data filed at 12/13/2019 0600 Gross per 24 hour  Intake 700 ml  Output 3050 ml  Net -2350 ml   Filed Weights   12/09/19 1248 12/09/19 1335 12/12/19 0536   Weight: (!) 149.7 kg (!) 147.4 kg (!) 143.7 kg   Gen: 44 y.o. male in no distress Pulm: Nonlabored breathing HFNC, tachypneic, crackles bilaterally. CV: Regular rate and rhythm. No murmur, rub, or gallop. No JVD, stable nonpitting dependent edema. GI: Abdomen soft, non-tender, non-distended, with normoactive bowel sounds.  Ext: Warm, no deformities Skin: No rashes, lesions or ulcers on visualized skin. Neuro: Alert and oriented. No focal neurological deficits. Psych: Judgement and insight appear fair. Mood euthymic & affect congruent. Behavior is appropriate.    Data Reviewed: I have personally reviewed following labs and imaging studies  CBC: Recent Labs  Lab 12/09/19 0830 12/10/19 0442 12/11/19 0334  WBC 5.2 4.0 8.9  NEUTROABS 4.2 2.8 7.0  HGB 15.1 15.0 14.7  HCT 44.0 43.8 42.7  MCV 92.2 92.2 92.2  PLT 148* 172 183   Basic Metabolic Panel: Recent Labs  Lab 12/09/19 0830 12/10/19 0442 12/11/19 0334 12/12/19 0432 12/13/19 0401  NA 131* 134* 134* 132* 133*  K 4.5 4.9 5.0 5.1 4.8  CL 96* 100 98 96* 97*  CO2 25 25 27 27 27   GLUCOSE 118* 178* 191* 181* 181*  BUN 13 15 17 20  22*  CREATININE 1.10 0.91 0.88 0.85 0.82  CALCIUM 7.9* 7.9* 8.1* 8.1* 7.9*  MG  --  2.3 2.4  --   --   PHOS  --  3.1 3.6  --   --    GFR: Estimated Creatinine Clearance: 171.8 mL/min (by C-G formula based on SCr of 0.82 mg/dL). Liver Function Tests: Recent Labs  Lab 12/09/19 0830 12/10/19 0442 12/11/19 0334 12/12/19 0432 12/13/19 0401  AST 248* 220* 148* 168* 205*  ALT 154* 151* 128* 191* 318*  ALKPHOS 50 50 45 46 43  BILITOT 0.4 0.7 0.6 0.8 0.9  PROT 6.8 6.7 6.7 6.7 6.6  ALBUMIN 3.7 3.5 3.4* 3.4* 3.4*   HbA1C: Recent Labs    12/12/19 0432  HGBA1C 6.1*   CBG: Recent Labs  Lab 12/12/19 0833 12/12/19 1156 12/12/19 1643 12/12/19 2124 12/13/19 0745  GLUCAP 174* 175* 183* 225* 170*   Anemia Panel: Recent Labs    12/11/19 0334  FERRITIN 1,747*   Recent Results (from the  past 240 hour(s))  Blood Culture (routine x 2)     Status: None (Preliminary result)   Collection Time: 12/09/19  8:08 AM   Specimen: BLOOD  Result Value Ref Range Status   Specimen Description   Final    BLOOD RIGHT ANTECUBITAL Performed at [facility], [address], [place], [place]    Special Requests   Final    BOTTLES DRAWN AEROBIC AND ANAEROBIC Blood Culture adequate volume Performed at [facility], [address], [place], [place]    Culture   Final    NO GROWTH 4 DAYS Performed at [facility], [address], [place], [address] [place]  Report Status PENDING  Incomplete  Blood Culture (routine x 2)     Status: None (Preliminary result)   Collection Time: 12/09/19  8:13 AM   Specimen: BLOOD  Result Value Ref Range Status   Specimen Description   Final    BLOOD LEFT ANTECUBITAL Performed at [facility], [address], [place], [place] [address]    Special Requests   Final    BOTTLES DRAWN AEROBIC AND ANAEROBIC Blood Culture results may not be optimal due to an excessive volume of blood received in culture bottles Performed at [facility], [address] [place], [place] [address]    Culture   Final    NO GROWTH 4 DAYS Performed at [facility], [address] [place], [place] [address]    Report Status PENDING  Incomplete  Resp Panel by RT-PCR (Flu A&B, Covid) Nasopharyngeal Swab     Status: Abnormal   Collection Time: 12/09/19  8:59 AM   Specimen: Nasopharyngeal Swab; Nasopharyngeal(NP) swabs in vial transport medium  Result Value Ref Range Status   SARS Coronavirus 2 by RT PCR POSITIVE (A) NEGATIVE Final    Comment: CRITICAL RESULT CALLED TO, READ BACK BY AND VERIFIED WITH: SIMSON,C @ 1412 12/09/2019 PARSONL (NOTE) SARS-CoV-2 target nucleic acids are DETECTED.  The SARS-CoV-2 RNA is generally detectable in upper respiratory specimens during the  acute phase of infection. Positive results are indicative of the presence of the identified virus, but do not rule out bacterial infection or co-infection with other pathogens not detected by the test. Clinical correlation with patient history and other diagnostic information is necessary to determine patient infection status. The expected result is Negative.  Fact Sheet for Patients: BloggerCourse.com  Fact Sheet for Healthcare Providers: SeriousBroker.it  This test is not yet approved or cleared by the [place] FDA and  has been authorized for detection and/or diagnosis of SARS-CoV-2 by FDA under an Emergency Use Authorization (EUA).  This EUA will remain in effect (meaning this t est can be used) for the duration of  the COVID-19 declaration under Section 564(b)(1) of the Act, 21 U.S.C. section 360bbb-3(b)(1), unless the authorization is terminated or revoked sooner.     Influenza A by PCR NEGATIVE NEGATIVE Final   Influenza B by PCR NEGATIVE NEGATIVE Final    Comment: (NOTE) The Xpert Xpress SARS-CoV-2/FLU/RSV plus assay is intended as an aid in the diagnosis of influenza from Nasopharyngeal swab specimens and should not be used as a sole basis for treatment. Nasal washings and aspirates are unacceptable for Xpert Xpress SARS-CoV-2/FLU/RSV testing.  Fact Sheet for Patients: BloggerCourse.com  Fact Sheet for Healthcare Providers: SeriousBroker.it  This test is not yet approved or cleared by the [place] FDA and has been authorized for detection and/or diagnosis of SARS-CoV-2 by FDA under an Emergency Use Authorization (EUA). This EUA will remain in effect (meaning this test can be used) for the duration of the COVID-19 declaration under Section 564(b)(1) of the Act, 21 U.S.C. section 360bbb-3(b)(1), unless the authorization is terminated or revoked.  Performed at  [facility], [address], [place], [place] [address]       Radiology Studies: No results found.  Scheduled Meds: . vitamin C  500 mg Oral Daily  . baricitinib  4 mg Oral Daily  . enoxaparin (LOVENOX) injection  70 mg Subcutaneous Q24H  . insulin aspart  0-15 Units Subcutaneous TID WC  . insulin aspart  0-5 Units Subcutaneous QHS  .  Ipratropium-Albuterol  1 puff Inhalation Q6H  . linagliptin  5 mg Oral Daily  . methylPREDNISolone (SOLU-MEDROL) injection  80 mg Intravenous Q12H  . pantoprazole  40 mg Oral Daily  . zinc sulfate  220 mg Oral Daily   Continuous Infusions:    LOS: 4 days   Time spent: 35 minutes.  Tyrone Nineyan B Zabdiel Dripps, MD [facility] [email or web address] 12/13/2019, 8:24 AM

## 2019-12-13 NOTE — Progress Notes (Signed)
Bilateral lower extremity venous duplex has been completed. Preliminary results can be found in CV Proc through chart review.   12/13/19 1:57 PM Olen Cordial RVT

## 2019-12-14 DIAGNOSIS — R7989 Other specified abnormal findings of blood chemistry: Secondary | ICD-10-CM | POA: Diagnosis not present

## 2019-12-14 DIAGNOSIS — E871 Hypo-osmolality and hyponatremia: Secondary | ICD-10-CM | POA: Diagnosis not present

## 2019-12-14 DIAGNOSIS — J9601 Acute respiratory failure with hypoxia: Secondary | ICD-10-CM | POA: Diagnosis not present

## 2019-12-14 LAB — COMPREHENSIVE METABOLIC PANEL
ALT: 294 U/L — ABNORMAL HIGH (ref 0–44)
AST: 125 U/L — ABNORMAL HIGH (ref 15–41)
Albumin: 3.7 g/dL (ref 3.5–5.0)
Alkaline Phosphatase: 49 U/L (ref 38–126)
Anion gap: 10 (ref 5–15)
BUN: 22 mg/dL — ABNORMAL HIGH (ref 6–20)
CO2: 27 mmol/L (ref 22–32)
Calcium: 8.1 mg/dL — ABNORMAL LOW (ref 8.9–10.3)
Chloride: 95 mmol/L — ABNORMAL LOW (ref 98–111)
Creatinine, Ser: 0.75 mg/dL (ref 0.61–1.24)
GFR, Estimated: >60 mL/min (ref >60)
Glucose, Bld: 195 mg/dL — ABNORMAL HIGH (ref 70–99)
Potassium: 5 mmol/L (ref 3.5–5.1)
Sodium: 132 mmol/L — ABNORMAL LOW (ref 135–145)
Total Bilirubin: 1 mg/dL (ref 0.3–1.2)
Total Protein: 6.8 g/dL (ref 6.5–8.1)

## 2019-12-14 LAB — CULTURE, BLOOD (ROUTINE X 2)
Culture: NO GROWTH
Culture: NO GROWTH
Special Requests: ADEQUATE

## 2019-12-14 LAB — C-REACTIVE PROTEIN: CRP: 0.6 mg/dL (ref <1.0)

## 2019-12-14 LAB — D-DIMER, QUANTITATIVE: D-Dimer, Quant: 0.88 ug/mL-FEU — ABNORMAL HIGH (ref 0.00–0.50)

## 2019-12-14 LAB — GLUCOSE, CAPILLARY
Glucose-Capillary: 160 mg/dL — ABNORMAL HIGH (ref 70–99)
Glucose-Capillary: 179 mg/dL — ABNORMAL HIGH (ref 70–99)
Glucose-Capillary: 168 mg/dL — ABNORMAL HIGH (ref 70–99)
Glucose-Capillary: 125 mg/dL — ABNORMAL HIGH (ref 70–99)

## 2019-12-14 NOTE — Progress Notes (Signed)
SATURATION QUALIFICATIONS: (This note is used to comply with regulatory documentation for home oxygen)  Patient Saturations on Room Air at Rest = 90%  Patient Saturations on Room Air while Ambulating = 85-88%  Patient Saturations on 4 Liters of oxygen while Ambulating = 87-92%  Please briefly explain why patient needs home oxygen: Testing performed on less oxygen; sats still dropped with exertion.

## 2019-12-14 NOTE — Progress Notes (Signed)
PROGRESS NOTE  Kenneth DriverChristopher A [name] YQM:[id number] DOB: 1975-03-30 DOA: 12/09/2019 PCP: Eartha InchBadger, Michael C, MD   LOS: 5 days   Brief Narrative / Interim history: 44 year old male with obesity, OSA, asthma, here with hypoxic respiratory failure due to COVID-19.  Subjective / 24h Interval events: He is asking about going home, he appears frustrated for being here  Assessment & Plan:  Principal Problem Acute Hypoxic Respiratory Failure due to Covid-19 Viral Illness -Patient started on remdesivir, Solu-Medrol, baricitinib -Overall improving and feels better but still requiring 8 L of oxygen -Continue incentive spirometry, ambulate as much as possible, wean off oxygen as tolerated.  When he gets to less than 6 L with ambulation should be able to go home   COVID-19 Labs  Recent Labs    12/12/19 0432 12/13/19 0401 12/13/19 0649 12/14/19 0455  DDIMER 0.75* 1.01*  --  0.88*  CRP 1.1*  --  0.6 0.6    Lab Results  Component Value Date   SARSCOV2NAA POSITIVE (A) 12/09/2019    Active Problems LFT elevation -Improving, remdesivir has been held but he received 4 doses.  Continue baricitinib, steroids -Right upper quadrant ultrasound 11/23 showed fatty liver infiltration, no gallstones or biliary distention.  There is a small hypoechoic focus in the right hepatic lobe, focal fatty versus true hepatic lesion.  This can be further worked up as an outpatient with an MRI of the abdomen  Leg swelling -Status post Lasix, lower extremity Doppler negative for DVT  Oropharyngeal candidiasis -Nystatin QID  OSA, asthma: No exacerbation at this time. -CPAP qHS, bronchodilators.  Hyperglycemia, prediabetes: HbA1c 6.1%. -Started SSI, linagliptin. Will initiate low dose basal coverage if CBG above inpatient goal today.  Hypovolemic hyponatremia  -Monitor.   HLD -Hold statin while LFTs elevated  Thrombocytopenia: Likely due to viral illness.   -Resolved.  Insomnia -Trazodone  Morbid obesity -He will benefit from weight loss, BMI 40   Scheduled Meds: . vitamin C  500 mg Oral Daily  . baricitinib  4 mg Oral Daily  . enoxaparin (LOVENOX) injection  70 mg Subcutaneous Q24H  . furosemide  40 mg Intravenous Daily  . insulin aspart  0-15 Units Subcutaneous TID WC  . insulin aspart  0-5 Units Subcutaneous QHS  . Ipratropium-Albuterol  1 puff Inhalation Q6H  . linagliptin  5 mg Oral Daily  . methylPREDNISolone (SOLU-MEDROL) injection  80 mg Intravenous Q12H  . nystatin  5 mL Mouth/Throat QID  . pantoprazole  40 mg Oral Daily  . traZODone  50 mg Oral QHS  . zinc sulfate  220 mg Oral Daily   Continuous Infusions: PRN Meds:.acetaminophen, alum & mag hydroxide-simeth, chlorpheniramine-HYDROcodone, guaiFENesin-dextromethorphan, ondansetron **OR** ondansetron (ZOFRAN) IV, phenol, sodium chloride  DVT prophylaxis: Lovenox Code Status: Full code Family Communication: d/w wife over the phone    Status is: Inpatient  Remains inpatient appropriate because:Inpatient level of care appropriate due to severity of illness   Dispo: The patient is from: Home              Anticipated d/c is to: Home              Anticipated d/c date is: 3 days              Patient currently is not medically stable to d/c.    Consultants:  None   Procedures:  None   Microbiology: None   Antibacterials: None    Objective: Vitals:   12/13/19 1745 12/13/19 2007 12/14/19 0511 12/14/19 1416  BP:  126/81 (!) 101/54 133/77  Pulse:  (!) 56 (!) 53 72  Resp:  17 17 20   Temp:   98.5 F (36.9 C) (!) 97.5 F (36.4 C)  TempSrc:   Oral Oral  SpO2: 94% 97% 98% 92%  Weight:   (!) 143.8 kg   Height:        Intake/Output Summary (Last 24 hours) at 12/14/2019 1456 Last data filed at 12/14/2019 1443 Gross per 24 hour  Intake 946 ml  Output 2450 ml  Net -1504 ml   Filed Weights   12/09/19 1335 12/12/19 0536 12/14/19 0511  Weight: (!) 147.4  kg (!) 143.7 kg (!) 143.8 kg    Examination:  Constitutional: NAD Eyes: no scleral icterus ENMT: Mucous membranes are moist.  Neck: normal, supple Respiratory: Faint bibasilar rhonchi, no wheezing, normal respiratory effort slightly tachypneic at times Cardiovascular: Regular rate and rhythm, no murmurs / rubs / gallops. No LE edema. Good peripheral pulses Abdomen: non distended, no tenderness. Bowel sounds positive.  Musculoskeletal: no clubbing / cyanosis.  Skin: no rashes Neurologic: CN 2-12 grossly intact. Strength 5/5 in all 4.  Psychiatric: Normal judgment and insight. Alert and oriented x 3. Normal mood.    Data Reviewed: I have independently reviewed following labs and imaging studies   CBC: Recent Labs  Lab 12/09/19 0830 12/10/19 0442 12/11/19 0334  WBC 5.2 4.0 8.9  NEUTROABS 4.2 2.8 7.0  HGB 15.1 15.0 14.7  HCT 44.0 43.8 42.7  MCV 92.2 92.2 92.2  PLT 148* 172 183   Basic Metabolic Panel: Recent Labs  Lab 12/10/19 0442 12/11/19 0334 12/12/19 0432 12/13/19 0401 12/14/19 0455  NA 134* 134* 132* 133* 132*  K 4.9 5.0 5.1 4.8 5.0  CL 100 98 96* 97* 95*  CO2 25 27 27 27 27   GLUCOSE 178* 191* 181* 181* 195*  BUN 15 17 20  22* 22*  CREATININE 0.91 0.88 0.85 0.82 0.75  CALCIUM 7.9* 8.1* 8.1* 7.9* 8.1*  MG 2.3 2.4  --   --   --   PHOS 3.1 3.6  --   --   --    GFR: Estimated Creatinine Clearance: 176.1 mL/min (by C-G formula based on SCr of 0.75 mg/dL). Liver Function Tests: Recent Labs  Lab 12/10/19 0442 12/11/19 0334 12/12/19 0432 12/13/19 0401 12/14/19 0455  AST 220* 148* 168* 205* 125*  ALT 151* 128* 191* 318* 294*  ALKPHOS 50 45 46 43 49  BILITOT 0.7 0.6 0.8 0.9 1.0  PROT 6.7 6.7 6.7 6.6 6.8  ALBUMIN 3.5 3.4* 3.4* 3.4* 3.7   No results for input(s): LIPASE, AMYLASE in the last 168 hours. No results for input(s): AMMONIA in the last 168 hours. Coagulation Profile: No results for input(s): INR, PROTIME in the last 168 hours. Cardiac  Enzymes: No results for input(s): CKTOTAL, CKMB, CKMBINDEX, TROPONINI in the last 168 hours. BNP (last 3 results) No results for input(s): PROBNP in the last 8760 hours. HbA1C: Recent Labs    12/12/19 0432  HGBA1C 6.1*   CBG: Recent Labs  Lab 12/13/19 1147 12/13/19 1710 12/13/19 2008 12/14/19 0748 12/14/19 1110  GLUCAP 178* 228* 175* 160* 179*   Lipid Profile: No results for input(s): CHOL, HDL, LDLCALC, TRIG, CHOLHDL, LDLDIRECT in the last 72 hours. Thyroid Function Tests: No results for input(s): TSH, T4TOTAL, FREET4, T3FREE, THYROIDAB in the last 72 hours. Anemia Panel: No results for input(s): VITAMINB12, FOLATE, FERRITIN, TIBC, IRON, RETICCTPCT in the last 72 hours. Urine analysis: No results found for: COLORURINE,  APPEARANCEUR, LABSPEC, PHURINE, GLUCOSEU, HGBUR, BILIRUBINUR, KETONESUR, PROTEINUR, UROBILINOGEN, NITRITE, LEUKOCYTESUR Sepsis Labs: Invalid input(s): PROCALCITONIN, LACTICIDVEN  Recent Results (from the past 240 hour(s))  Blood Culture (routine x 2)     Status: None   Collection Time: 12/09/19  8:08 AM   Specimen: BLOOD  Result Value Ref Range Status   Specimen Description   Final    BLOOD RIGHT ANTECUBITAL Performed at [facility], [address], [place], [place] [address]    Special Requests   Final    BOTTLES DRAWN AEROBIC AND ANAEROBIC Blood Culture adequate volume Performed at [facility], [address] [place], [place] [address]    Culture   Final    NO GROWTH 5 DAYS Performed at [facility], [address] [place], [place] [address]    Report Status 12/14/2019 FINAL  Final  Blood Culture (routine x 2)     Status: None   Collection Time: 12/09/19  8:13 AM   Specimen: BLOOD  Result Value Ref Range Status   Specimen Description   Final    BLOOD LEFT ANTECUBITAL Performed at [facility], [address] [place], [place] [address]    Special Requests   Final    BOTTLES  DRAWN AEROBIC AND ANAEROBIC Blood Culture results may not be optimal due to an excessive volume of blood received in culture bottles Performed at [facility], [address] [place], [place] [address]    Culture   Final    NO GROWTH 5 DAYS Performed at [facility], [address], [place], [place] [address]    Report Status 12/14/2019 FINAL  Final  Resp Panel by RT-PCR (Flu A&B, Covid) Nasopharyngeal Swab     Status: Abnormal   Collection Time: 12/09/19  8:59 AM   Specimen: Nasopharyngeal Swab; Nasopharyngeal(NP) swabs in vial transport medium  Result Value Ref Range Status   SARS Coronavirus 2 by RT PCR POSITIVE (A) NEGATIVE Final    Comment: CRITICAL RESULT CALLED TO, READ BACK BY AND VERIFIED WITH: SIMSON,C @ 1412 12/09/2019 PARSONL (NOTE) SARS-CoV-2 target nucleic acids are DETECTED.  The SARS-CoV-2 RNA is generally detectable in upper respiratory specimens during the acute phase of infection. Positive results are indicative of the presence of the identified virus, but do not rule out bacterial infection or co-infection with other pathogens not detected by the test. Clinical correlation with patient history and other diagnostic information is necessary to determine patient infection status. The expected result is Negative.  Fact Sheet for Patients: BloggerCourse.com  Fact Sheet for Healthcare Providers: SeriousBroker.it  This test is not yet approved or cleared by the [place] FDA and  has been authorized for detection and/or diagnosis of SARS-CoV-2 by FDA under an Emergency Use Authorization (EUA).  This EUA will remain in effect (meaning this t est can be used) for the duration of  the COVID-19 declaration under Section 564(b)(1) of the Act, 21 U.S.C. section 360bbb-3(b)(1), unless the authorization is terminated or revoked sooner.     Influenza A by PCR NEGATIVE NEGATIVE Final    Influenza B by PCR NEGATIVE NEGATIVE Final    Comment: (NOTE) The Xpert Xpress SARS-CoV-2/FLU/RSV plus assay is intended as an aid in the diagnosis of influenza from Nasopharyngeal swab specimens and should not be used as a sole basis for treatment. Nasal washings and aspirates are unacceptable for Xpert Xpress SARS-CoV-2/FLU/RSV testing.  Fact Sheet for Patients: BloggerCourse.com  Fact Sheet for Healthcare  Providers: SeriousBroker.it  This test is not yet approved or cleared by the [place] and has been authorized for detection and/or diagnosis of SARS-CoV-2 by FDA under an Emergency Use Authorization (EUA). This EUA will remain in effect (meaning this test can be used) for the duration of the COVID-19 declaration under Section 564(b)(1) of the Act, 21 U.S.C. section 360bbb-3(b)(1), unless the authorization is terminated or revoked.  Performed at [facility], [address] [place], [place] [address]       Radiology Studies: VAS [place] LOWER EXTREMITY VENOUS (DVT)  Result Date: 12/13/2019  Lower Venous DVT Study Indications: Elevated Ddimer.  Risk Factors: COVID 19 positive. Limitations: Poor ultrasound/tissue interface. Comparison Study: No prior studies. Performing Technologist: Chanda Busing RVT  Examination Guidelines: A complete evaluation includes B-mode imaging, spectral Doppler, color Doppler, and power Doppler as needed of all accessible portions of each vessel. Bilateral testing is considered an integral part of a complete examination. Limited examinations for reoccurring indications may be performed as noted. The reflux portion of the exam is performed with the patient in reverse Trendelenburg.  +---------+---------------+---------+-----------+----------+--------------+ RIGHT    CompressibilityPhasicitySpontaneityPropertiesThrombus Aging  +---------+---------------+---------+-----------+----------+--------------+ CFV      Full           Yes      Yes                                 +---------+---------------+---------+-----------+----------+--------------+ SFJ      Full                                                        +---------+---------------+---------+-----------+----------+--------------+ FV Prox  Full                                                        +---------+---------------+---------+-----------+----------+--------------+ FV Mid   Full                                                        +---------+---------------+---------+-----------+----------+--------------+ FV DistalFull                                                        +---------+---------------+---------+-----------+----------+--------------+ PFV      Full                                                        +---------+---------------+---------+-----------+----------+--------------+ POP      Full           Yes      Yes                                 +---------+---------------+---------+-----------+----------+--------------+  PTV      Full                                                        +---------+---------------+---------+-----------+----------+--------------+ PERO     Full                                                        +---------+---------------+---------+-----------+----------+--------------+   +---------+---------------+---------+-----------+----------+--------------+ LEFT     CompressibilityPhasicitySpontaneityPropertiesThrombus Aging +---------+---------------+---------+-----------+----------+--------------+ CFV      Full           Yes      Yes                                 +---------+---------------+---------+-----------+----------+--------------+ SFJ      Full                                                         +---------+---------------+---------+-----------+----------+--------------+ FV Prox  Full                                                        +---------+---------------+---------+-----------+----------+--------------+ FV Mid   Full                                                        +---------+---------------+---------+-----------+----------+--------------+ FV DistalFull                                                        +---------+---------------+---------+-----------+----------+--------------+ PFV      Full                                                        +---------+---------------+---------+-----------+----------+--------------+ POP      Full           Yes      Yes                                 +---------+---------------+---------+-----------+----------+--------------+ PTV      Full                                                        +---------+---------------+---------+-----------+----------+--------------+   PERO     Full                                                        +---------+---------------+---------+-----------+----------+--------------+     Summary: RIGHT: - There is no evidence of deep vein thrombosis in the lower extremity.  - No cystic structure found in the popliteal fossa.  LEFT: - There is no evidence of deep vein thrombosis in the lower extremity.  - No cystic structure found in the popliteal fossa.  *See table(s) above for measurements and observations. Electronically signed by Waverly Ferrari MD on 12/13/2019 at 3:14:19 PM.    Final    US Abdomen Limited RUQ (LIVER/GB)  Result Date: 12/13/2019 CLINICAL DATA:  Elevated LFTs. EXAM: ULTRASOUND ABDOMEN LIMITED RIGHT UPPER QUADRANT COMPARISON:  None. No prior. FINDINGS: Gallbladder: No gallstones or wall thickening visualized. No sonographic Murphy sign noted by sonographer. Common bile duct: Diameter: 4.6 mm Liver: Increased echogenicity consistent fatty infiltration  or hepatocellular disease. 2.4 x 1.9 x 2.1 cm rounded hypoechoic focus noted in the right hepatic lobe. Although this could represent focal fatty sparing a true hepatic lesion cannot be excluded and gadolinium-enhanced MRI of the abdomen is suggested for further evaluation. Portal vein is patent with normal direction of blood flow. Other: None. IMPRESSION: 1. No gallstones or biliary distention. 2. Increased echogenicity of the liver consistent fatty infiltration or hepatocellular disease. 2.4 x 1.9 x 2.1 cm rounded hypoechoic focus noted in the right hepatic lobe. Although this could represent focal fatty sparing a true hepatic lesion cannot be excluded and gadolinium-enhanced MRI of the abdomen is suggested for further evaluation. Electronically Signed   By: Maisie Fus  Register   On: 12/13/2019 08:39     Cherokee Clowers Elvera Lennox, MD, PhD [facility]  Between 7 am - 7 pm I am available, please contact me via Amion or Securechat  Between 7 pm - 7 am I am not available, please contact night coverage MD/APP via Amion

## 2019-12-14 NOTE — Progress Notes (Signed)
Pt continues to refuse CPAP qhs.  Machine remains in room and RT available should he change his mind.

## 2019-12-14 NOTE — Progress Notes (Signed)
SATURATION QUALIFICATIONS: (This note is used to comply with regulatory documentation for home oxygen)  Patient Saturations on Room Air at Rest =  88%  Patient Saturations on Room Air while Ambulating = 83-85%  Patient Saturations on 5 Liters of oxygen while Ambulating = 90-92%  Please briefly explain why patient needs home oxygen: Pt short of breath with ambulation while on oxygen and worsens while on room air.

## 2019-12-15 DIAGNOSIS — E871 Hypo-osmolality and hyponatremia: Secondary | ICD-10-CM | POA: Diagnosis not present

## 2019-12-15 DIAGNOSIS — J9601 Acute respiratory failure with hypoxia: Secondary | ICD-10-CM | POA: Diagnosis not present

## 2019-12-15 DIAGNOSIS — R7989 Other specified abnormal findings of blood chemistry: Secondary | ICD-10-CM | POA: Diagnosis not present

## 2019-12-15 LAB — GLUCOSE, CAPILLARY
Glucose-Capillary: 121 mg/dL — ABNORMAL HIGH (ref 70–99)
Glucose-Capillary: 167 mg/dL — ABNORMAL HIGH (ref 70–99)
Glucose-Capillary: 168 mg/dL — ABNORMAL HIGH (ref 70–99)
Glucose-Capillary: 155 mg/dL — ABNORMAL HIGH (ref 70–99)

## 2019-12-15 MED ORDER — IPRATROPIUM-ALBUTEROL 20-100 MCG/ACT IN AERS
1.0000 | INHALATION_SPRAY | Freq: Three times a day (TID) | RESPIRATORY_TRACT | Status: DC
  Administered 2019-12-16: 1 via RESPIRATORY_TRACT

## 2019-12-15 NOTE — Progress Notes (Signed)
Pt refuses CPAP QHS, machine remains at bedside.  RT to monitor and assess as needed.

## 2019-12-15 NOTE — Progress Notes (Signed)
PROGRESS NOTE  Kenneth [name] GHW:[id number] DOB: 08/05/1975 DOA: 12/09/2019 PCP: Eartha Inch, MD   LOS: 6 days   Brief Narrative / Interim history: 44 year old male with obesity, OSA, asthma, here with hypoxic respiratory failure due to COVID-19.  Subjective / 24h Interval events: Very frustrated this morning.  Wants to go home  Assessment & Plan:  Principal Problem Acute Hypoxic Respiratory Failure due to Covid-19 Viral Illness -Patient started on remdesivir, Solu-Medrol, baricitinib -Overall improving and feels better but still requiring 5 L of oxygen at rest with sats in the mid 80s -Continue incentive spirometry, ambulate as much as possible, wean off oxygen as tolerated.  When he gets to less than 6 L with ambulation should be able to go home   COVID-19 Labs  Recent Labs    12/13/19 0401 12/13/19 0649 12/14/19 0455  DDIMER 1.01*  --  0.88*  CRP  --  0.6 0.6    Lab Results  Component Value Date   SARSCOV2NAA POSITIVE (A) 12/09/2019    Active Problems LFT elevation -Improving, remdesivir has been held but he received 4 doses.  Continue baricitinib, steroids -Right upper quadrant ultrasound 11/23 showed fatty liver infiltration, no gallstones or biliary distention.  There is a small hypoechoic focus in the right hepatic lobe, focal fatty versus true hepatic lesion.  This can be further worked up as an outpatient with an MRI of the abdomen  Leg swelling -Status post Lasix, lower extremity Doppler negative for DVT  Oropharyngeal candidiasis -Nystatin QID  OSA, asthma: No exacerbation at this time. -CPAP qHS, bronchodilators.  Hyperglycemia, prediabetes: HbA1c 6.1%. -Started SSI, linagliptin. Will initiate low dose basal coverage if CBG above inpatient goal today.  Hypovolemic hyponatremia  -Monitor.   HLD -Hold statin while LFTs elevated  Thrombocytopenia: Likely due to viral illness.  -Resolved.  Insomnia -Trazodone  Morbid  obesity -He will benefit from weight loss, BMI 40   Scheduled Meds: . vitamin C  500 mg Oral Daily  . baricitinib  4 mg Oral Daily  . enoxaparin (LOVENOX) injection  70 mg Subcutaneous Q24H  . furosemide  40 mg Intravenous Daily  . insulin aspart  0-15 Units Subcutaneous TID WC  . insulin aspart  0-5 Units Subcutaneous QHS  . Ipratropium-Albuterol  1 puff Inhalation Q6H  . linagliptin  5 mg Oral Daily  . methylPREDNISolone (SOLU-MEDROL) injection  80 mg Intravenous Q12H  . nystatin  5 mL Mouth/Throat QID  . pantoprazole  40 mg Oral Daily  . traZODone  50 mg Oral QHS  . zinc sulfate  220 mg Oral Daily   Continuous Infusions: PRN Meds:.acetaminophen, alum & mag hydroxide-simeth, chlorpheniramine-HYDROcodone, guaiFENesin-dextromethorphan, ondansetron **OR** ondansetron (ZOFRAN) IV, phenol, sodium chloride  DVT prophylaxis: Lovenox Code Status: Full code Family Communication: No family present   Status is: Inpatient  Remains inpatient appropriate because:Inpatient level of care appropriate due to severity of illness   Dispo: The patient is from: Home              Anticipated d/c is to: Home              Anticipated d/c date is: 3 days              Patient currently is not medically stable to d/c.    Consultants:  None   Procedures:  None   Microbiology: None   Antibacterials: None    Objective: Vitals:   12/14/19 0511 12/14/19 1416 12/14/19 2123 12/15/19 0330  BP: (!) 101/54  133/77 136/80 124/79  Pulse: (!) 53 72 67 98  Resp: 17 20  (!) 21  Temp: 98.5 F (36.9 C) (!) 97.5 F (36.4 C) 98.2 F (36.8 C) 98.2 F (36.8 C)  TempSrc: Oral Oral  Oral  SpO2: 98% 92% (!) 89% (!) 86%  Weight: (!) 143.8 kg     Height:        Intake/Output Summary (Last 24 hours) at 12/15/2019 1129 Last data filed at 12/15/2019 0820 Gross per 24 hour  Intake 1304 ml  Output 2925 ml  Net -1621 ml   Filed Weights   12/09/19 1335 12/12/19 0536 12/14/19 0511  Weight: (!) 147.4  kg (!) 143.7 kg (!) 143.8 kg    Examination:  Constitutional: No distress Eyes: No scleral icterus ENMT: Moist external drains Neck: normal, supple Respiratory: Faint rhonchi, no wheezing, normal respiratory effort Cardiovascular: Regular rate and rhythm, no murmurs, no peripheral edema Abdomen: Soft, NT, ND, bowel sounds positive Musculoskeletal: no clubbing / cyanosis.  Skin: No rash seen Neurologic: Nonfocal, ambulatory.    Data Reviewed: I have independently reviewed following labs and imaging studies   CBC: Recent Labs  Lab 12/09/19 0830 12/10/19 0442 12/11/19 0334  WBC 5.2 4.0 8.9  NEUTROABS 4.2 2.8 7.0  HGB 15.1 15.0 14.7  HCT 44.0 43.8 42.7  MCV 92.2 92.2 92.2  PLT 148* 172 183   Basic Metabolic Panel: Recent Labs  Lab 12/10/19 0442 12/11/19 0334 12/12/19 0432 12/13/19 0401 12/14/19 0455  NA 134* 134* 132* 133* 132*  K 4.9 5.0 5.1 4.8 5.0  CL 100 98 96* 97* 95*  CO2 25 27 27 27 27   GLUCOSE 178* 191* 181* 181* 195*  BUN 15 17 20  22* 22*  CREATININE 0.91 0.88 0.85 0.82 0.75  CALCIUM 7.9* 8.1* 8.1* 7.9* 8.1*  MG 2.3 2.4  --   --   --   PHOS 3.1 3.6  --   --   --    GFR: Estimated Creatinine Clearance: 176.1 mL/min (by C-G formula based on SCr of 0.75 mg/dL). Liver Function Tests: Recent Labs  Lab 12/10/19 0442 12/11/19 0334 12/12/19 0432 12/13/19 0401 12/14/19 0455  AST 220* 148* 168* 205* 125*  ALT 151* 128* 191* 318* 294*  ALKPHOS 50 45 46 43 49  BILITOT 0.7 0.6 0.8 0.9 1.0  PROT 6.7 6.7 6.7 6.6 6.8  ALBUMIN 3.5 3.4* 3.4* 3.4* 3.7   No results for input(s): LIPASE, AMYLASE in the last 168 hours. No results for input(s): AMMONIA in the last 168 hours. Coagulation Profile: No results for input(s): INR, PROTIME in the last 168 hours. Cardiac Enzymes: No results for input(s): CKTOTAL, CKMB, CKMBINDEX, TROPONINI in the last 168 hours. BNP (last 3 results) No results for input(s): PROBNP in the last 8760 hours. HbA1C: No results for  input(s): HGBA1C in the last 72 hours. CBG: Recent Labs  Lab 12/14/19 0748 12/14/19 1110 12/14/19 1639 12/14/19 2120 12/15/19 0729  GLUCAP 160* 179* 168* 125* 121*   Lipid Profile: No results for input(s): CHOL, HDL, LDLCALC, TRIG, CHOLHDL, LDLDIRECT in the last 72 hours. Thyroid Function Tests: No results for input(s): TSH, T4TOTAL, FREET4, T3FREE, THYROIDAB in the last 72 hours. Anemia Panel: No results for input(s): VITAMINB12, FOLATE, FERRITIN, TIBC, IRON, RETICCTPCT in the last 72 hours. Urine analysis: No results found for: COLORURINE, APPEARANCEUR, LABSPEC, PHURINE, GLUCOSEU, HGBUR, BILIRUBINUR, KETONESUR, PROTEINUR, UROBILINOGEN, NITRITE, LEUKOCYTESUR Sepsis Labs: Invalid input(s): PROCALCITONIN, LACTICIDVEN  Recent Results (from the past 240 hour(s))  Blood Culture (routine  x 2)     Status: None   Collection Time: 12/09/19  8:08 AM   Specimen: BLOOD  Result Value Ref Range Status   Specimen Description   Final    BLOOD RIGHT ANTECUBITAL Performed at [facility], [address] [place], [place] [address]    Special Requests   Final    BOTTLES DRAWN AEROBIC AND ANAEROBIC Blood Culture adequate volume Performed at [facility], [address], [place], [place] [address]    Culture   Final    NO GROWTH 5 DAYS Performed at [facility], [address], [place], [place] [address]    Report Status 12/14/2019 FINAL  Final  Blood Culture (routine x 2)     Status: None   Collection Time: 12/09/19  8:13 AM   Specimen: BLOOD  Result Value Ref Range Status   Specimen Description   Final    BLOOD LEFT ANTECUBITAL Performed at [facility], [address] [place], [place] [address]    Special Requests   Final    BOTTLES DRAWN AEROBIC AND ANAEROBIC Blood Culture results may not be optimal due to an excessive volume of blood received in culture bottles Performed at [facility], [address], [place], [place] [address]    Culture   Final    NO GROWTH 5 DAYS Performed at [facility], [address], [place], [place] [address]    Report Status 12/14/2019 FINAL  Final  Resp Panel by RT-PCR (Flu A&B, Covid) Nasopharyngeal Swab     Status: Abnormal   Collection Time: 12/09/19  8:59 AM   Specimen: Nasopharyngeal Swab; Nasopharyngeal(NP) swabs in vial transport medium  Result Value Ref Range Status   SARS Coronavirus 2 by RT PCR POSITIVE (A) NEGATIVE Final    Comment: CRITICAL RESULT CALLED TO, READ BACK BY AND VERIFIED WITH: SIMSON,C @ 1412 12/09/2019 PARSONL (NOTE) SARS-CoV-2 target nucleic acids are DETECTED.  The SARS-CoV-2 RNA is generally detectable in upper respiratory specimens during the acute phase of infection. Positive results are indicative of the presence of the identified virus, but do not rule out bacterial infection or co-infection with other pathogens not detected by the test. Clinical correlation with patient history and other diagnostic information is necessary to determine patient infection status. The expected result is Negative.  Fact Sheet for Patients: BloggerCourse.com  Fact Sheet for Healthcare Providers: SeriousBroker.it  This test is not yet approved or cleared by the [place] FDA and  has been authorized for detection and/or diagnosis of SARS-CoV-2 by FDA under an Emergency Use Authorization (EUA).  This EUA will remain in effect (meaning this t est can be used) for the duration of  the COVID-19 declaration under Section 564(b)(1) of the Act, 21 U.S.C. section 360bbb-3(b)(1), unless the authorization is terminated or revoked sooner.     Influenza A by PCR NEGATIVE NEGATIVE Final   Influenza B by PCR NEGATIVE NEGATIVE Final    Comment: (NOTE) The Xpert Xpress SARS-CoV-2/FLU/RSV plus assay is intended as an aid in the diagnosis of influenza from Nasopharyngeal swab  specimens and should not be used as a sole basis for treatment. Nasal washings and aspirates are unacceptable for Xpert Xpress SARS-CoV-2/FLU/RSV testing.  Fact Sheet for Patients: BloggerCourse.com  Fact Sheet for Healthcare Providers: SeriousBroker.it  This test is not yet approved or cleared by the [place] FDA and has been authorized for detection and/or diagnosis of SARS-CoV-2 by FDA under  an Emergency Use Authorization (EUA). This EUA will remain in effect (meaning this test can be used) for the duration of the COVID-19 declaration under Section 564(b)(1) of the Act, 21 U.S.C. section 360bbb-3(b)(1), unless the authorization is terminated or revoked.  Performed at [facility], [address] [place], [place] [id number]       Radiology Studies: VAS US LOWER EXTREMITY VENOUS (DVT)  Result Date: 12/13/2019  Lower Venous DVT Study Indications: Elevated Ddimer.  Risk Factors: COVID 19 positive. Limitations: Poor ultrasound/tissue interface. Comparison Study: No prior studies. Performing Technologist: Chanda BusingGregory Collins RVT  Examination Guidelines: A complete evaluation includes B-mode imaging, spectral Doppler, color Doppler, and power Doppler as needed of all accessible portions of each vessel. Bilateral testing is considered an integral part of a complete examination. Limited examinations for reoccurring indications may be performed as noted. The reflux portion of the exam is performed with the patient in reverse Trendelenburg.  +---------+---------------+---------+-----------+----------+--------------+ RIGHT    CompressibilityPhasicitySpontaneityPropertiesThrombus Aging +---------+---------------+---------+-----------+----------+--------------+ CFV      Full           Yes      Yes                                 +---------+---------------+---------+-----------+----------+--------------+ SFJ       Full                                                        +---------+---------------+---------+-----------+----------+--------------+ FV Prox  Full                                                        +---------+---------------+---------+-----------+----------+--------------+ FV Mid   Full                                                        +---------+---------------+---------+-----------+----------+--------------+ FV DistalFull                                                        +---------+---------------+---------+-----------+----------+--------------+ PFV      Full                                                        +---------+---------------+---------+-----------+----------+--------------+ POP      Full           Yes      Yes                                 +---------+---------------+---------+-----------+----------+--------------+ PTV      Full                                                        +---------+---------------+---------+-----------+----------+--------------+  PERO     Full                                                        +---------+---------------+---------+-----------+----------+--------------+   +---------+---------------+---------+-----------+----------+--------------+ LEFT     CompressibilityPhasicitySpontaneityPropertiesThrombus Aging +---------+---------------+---------+-----------+----------+--------------+ CFV      Full           Yes      Yes                                 +---------+---------------+---------+-----------+----------+--------------+ SFJ      Full                                                        +---------+---------------+---------+-----------+----------+--------------+ FV Prox  Full                                                        +---------+---------------+---------+-----------+----------+--------------+ FV Mid   Full                                                         +---------+---------------+---------+-----------+----------+--------------+ FV DistalFull                                                        +---------+---------------+---------+-----------+----------+--------------+ PFV      Full                                                        +---------+---------------+---------+-----------+----------+--------------+ POP      Full           Yes      Yes                                 +---------+---------------+---------+-----------+----------+--------------+ PTV      Full                                                        +---------+---------------+---------+-----------+----------+--------------+ PERO     Full                                                        +---------+---------------+---------+-----------+----------+--------------+  Summary: RIGHT: - There is no evidence of deep vein thrombosis in the lower extremity.  - No cystic structure found in the popliteal fossa.  LEFT: - There is no evidence of deep vein thrombosis in the lower extremity.  - No cystic structure found in the popliteal fossa.  *See table(s) above for measurements and observations. Electronically signed by Waverly Ferrari MD on 12/13/2019 at 3:14:19 PM.    Final      Pamella Pert, MD, PhD [facility]  Between 7 am - 7 pm I am available, please contact me via Amion or Securechat  Between 7 pm - 7 am I am not available, please contact night coverage MD/APP via Amion

## 2019-12-16 DIAGNOSIS — E871 Hypo-osmolality and hyponatremia: Secondary | ICD-10-CM | POA: Diagnosis not present

## 2019-12-16 DIAGNOSIS — J189 Pneumonia, unspecified organism: Secondary | ICD-10-CM | POA: Diagnosis not present

## 2019-12-16 DIAGNOSIS — R7989 Other specified abnormal findings of blood chemistry: Secondary | ICD-10-CM | POA: Diagnosis not present

## 2019-12-16 DIAGNOSIS — J9601 Acute respiratory failure with hypoxia: Secondary | ICD-10-CM | POA: Diagnosis not present

## 2019-12-16 LAB — CREATININE, SERUM
Creatinine, Ser: 0.78 mg/dL (ref 0.61–1.24)
GFR, Estimated: >60 mL/min (ref >60)

## 2019-12-16 LAB — GLUCOSE, CAPILLARY
Glucose-Capillary: 154 mg/dL — ABNORMAL HIGH (ref 70–99)
Glucose-Capillary: 140 mg/dL — ABNORMAL HIGH (ref 70–99)

## 2019-12-16 MED ORDER — DEXAMETHASONE 6 MG PO TABS: 6.0000 mg | ORAL_TABLET | Freq: Every day | ORAL | 0 refills | Status: DC

## 2019-12-16 MED ORDER — IPRATROPIUM-ALBUTEROL 20-100 MCG/ACT IN AERS: 1.0000 | INHALATION_SPRAY | Freq: Three times a day (TID) | RESPIRATORY_TRACT | 0 refills | Status: DC

## 2019-12-16 MED ORDER — GUAIFENESIN-DM 100-10 MG/5ML PO SYRP: 10.0000 mL | ORAL_SOLUTION | ORAL | 0 refills | Status: DC | PRN

## 2019-12-16 NOTE — Progress Notes (Signed)
SATURATION QUALIFICATIONS: (This note is used to comply with regulatory documentation for home oxygen)  Patient Saturations on Room Air at Rest = 92%  Patient Saturations on Room Air while Ambulating = 85%  Patient Saturations on 4 Liters of oxygen while Ambulating =90%  Please briefly explain why patient needs home oxygen: pt desats to mid 80's once exerting energy. Needs O2 to help raise saturation levels to ideal saturation levels

## 2019-12-16 NOTE — Discharge Summary (Signed)
Physician Discharge Summary  [name] XHF:[id number] DOB: 1975/02/16 DOA: 12/09/2019  PCP: Kenneth Inch, MD  Admit date: 12/09/2019 Discharge date: 12/16/2019  Admitted From: home Disposition:  home  Recommendations for Outpatient Follow-up:  1. Follow up with PCP in 1-2 weeks 2. Further work-up of the liver lesion with an MRI as an outpatient  Home Health: None Equipment/Devices: Home oxygen  Discharge Condition: Stable CODE STATUS: Full code Diet recommendation: Regular diet  HPI: Per admitting MD, Kenneth [name] is a 44 y.o. male with medical history significant of HLD. Presenting with fever, dyspnea, and cough. He reports that 10 days ago he had a fever that seemed to last all day. He can not tell me the actual temp, but it didn't seem to respond to OTC meds. When it continued into the next day, he became concerned because he wife had recently tested positive for COVID. He did a home test and was found positive for COVID. He took some more OTC meds (APAP, ibuprofen, and a decongestant). They didn't help. He went to urgent care and was given prednisone and azithromycin. These didn't help. His dyspnea progressed to the point that he was too uncomfortable at home. He then came to the ED.   Hospital Course / Discharge diagnoses: Principal Problem Acute Hypoxic Respiratory Failure due to Covid-19 Viral Illness -patient was admitted to the hospital and started on steroids, Remdesivir, baricitinib.  His hypoxia improved, he was able to be weaned down to 4 L, feeling back to baseline, no significant shortness of breath, able to ambulate and asking to go home.  He has remained stable, improved, and will be discharged home with home O2.  He was advised to follow-up with PCP within the next couple of weeks  Active Problems LFT elevation -Improving, remdesivir has been held but he received 4 doses. Right upper quadrant ultrasound 11/23 showed fatty liver infiltration,  no gallstones or biliary distention.  There is a small hypoechoic focus in the right hepatic lobe, focal fatty versus true hepatic lesion.  This can be further worked up as an outpatient with an MRI of the abdomen Leg swelling -Status post Lasix, lower extremity Doppler negative for DVT Oropharyngeal candidiasis -improved OSA, asthma: No exacerbationat this time. Hyperglycemia, prediabetes: HbA1c 6.1%. Hypovolemic hyponatremia -Stable HLD -on statin Thrombocytopenia: Likely due to viral illness. Resolved. Morbid obesity -He will benefit from weight loss, BMI 40  Discharge Instructions   Allergies as of 12/16/2019   No Known Allergies     Medication List    STOP taking these medications   benzonatate 100 MG capsule Commonly known as: TESSALON   oxyCODONE-acetaminophen 5-325 MG tablet Commonly known as: Roxicet     TAKE these medications   azithromycin 250 MG tablet Commonly known as: ZITHROMAX Take 250 mg by mouth as directed. Take two tablets on the first day and then one tablet every day after.   cetirizine 10 MG tablet Commonly known as: ZYRTEC Take 10 mg by mouth daily as needed for allergies.   dexamethasone 6 MG tablet Commonly known as: DECADRON Take 1 tablet (6 mg total) by mouth daily.   docusate sodium 100 MG capsule Commonly known as: Colace Take 1 capsule (100 mg total) by mouth 2 (two) times daily.   guaiFENesin-dextromethorphan 100-10 MG/5ML syrup Commonly known as: ROBITUSSIN DM Take 10 mLs by mouth every 4 (four) hours as needed for cough.   Ipratropium-Albuterol 20-100 MCG/ACT Aers respimat Commonly known as: COMBIVENT Inhale 1 puff into  the lungs 3 (three) times daily.   pantoprazole 40 MG tablet Commonly known as: PROTONIX Take 40 mg by mouth daily.   rosuvastatin 20 MG tablet Commonly known as: CRESTOR Take 20 mg by mouth at bedtime.   testosterone cypionate 200 MG/ML injection Commonly known as: DEPOTESTOSTERONE CYPIONATE Inject 1 mL  into the muscle every 14 (fourteen) days.            Durable Medical Equipment  (From admission, onward)         Start     Ordered   12/16/19 0738  For home use only DME oxygen  Once       Question Answer Comment  Length of Need 6 Months   Mode or (Route) Nasal cannula   Liters per Minute 4   Frequency Continuous (stationary and portable oxygen unit needed)   Oxygen conserving device Yes   Oxygen delivery system Gas      12/16/19 0737           Consultations:  None   Procedures/Studies:  DG Chest Port 1 View  Result Date: 12/09/2019 CLINICAL DATA:  Shortness of breath, COVID positive with worsening symptoms EXAM: PORTABLE CHEST 1 VIEW COMPARISON:  December of 2006, no recent comparison is available. FINDINGS: Lung volumes are decreased. Cardiomediastinal contours accentuated by portable technique and low lung volumes. Patchy peripheral and mid chest to basilar opacities bilaterally. No lobar consolidation. No sign of pleural effusion. On limited assessment no acute skeletal process. IMPRESSION: Low volume chest with patchy peripheral and mid chest to basilar opacities bilaterally, compatible with pneumonia, pattern could be seen in the setting of COVID infection. Electronically Signed   By: Donzetta KohutGeoffrey  Wile M.D.   On: 12/09/2019 09:00   VAS US LOWER EXTREMITY VENOUS (DVT)  Result Date: 12/13/2019  Lower Venous DVT Study Indications: Elevated Ddimer.  Risk Factors: COVID 19 positive. Limitations: Poor ultrasound/tissue interface. Comparison Study: No prior studies. Performing Technologist: Chanda BusingGregory Collins RVT  Examination Guidelines: A complete evaluation includes B-mode imaging, spectral Doppler, color Doppler, and power Doppler as needed of all accessible portions of each vessel. Bilateral testing is considered an integral part of a complete examination. Limited examinations for reoccurring indications may be performed as noted. The reflux portion of the exam is performed  with the patient in reverse Trendelenburg.  +---------+---------------+---------+-----------+----------+--------------+ RIGHT    CompressibilityPhasicitySpontaneityPropertiesThrombus Aging +---------+---------------+---------+-----------+----------+--------------+ CFV      Full           Yes      Yes                                 +---------+---------------+---------+-----------+----------+--------------+ SFJ      Full                                                        +---------+---------------+---------+-----------+----------+--------------+ FV Prox  Full                                                        +---------+---------------+---------+-----------+----------+--------------+ FV Mid   Full                                                        +---------+---------------+---------+-----------+----------+--------------+  FV DistalFull                                                        +---------+---------------+---------+-----------+----------+--------------+ PFV      Full                                                        +---------+---------------+---------+-----------+----------+--------------+ POP      Full           Yes      Yes                                 +---------+---------------+---------+-----------+----------+--------------+ PTV      Full                                                        +---------+---------------+---------+-----------+----------+--------------+ PERO     Full                                                        +---------+---------------+---------+-----------+----------+--------------+   +---------+---------------+---------+-----------+----------+--------------+ LEFT     CompressibilityPhasicitySpontaneityPropertiesThrombus Aging +---------+---------------+---------+-----------+----------+--------------+ CFV      Full           Yes      Yes                                  +---------+---------------+---------+-----------+----------+--------------+ SFJ      Full                                                        +---------+---------------+---------+-----------+----------+--------------+ FV Prox  Full                                                        +---------+---------------+---------+-----------+----------+--------------+ FV Mid   Full                                                        +---------+---------------+---------+-----------+----------+--------------+ FV DistalFull                                                        +---------+---------------+---------+-----------+----------+--------------+  PFV      Full                                                        +---------+---------------+---------+-----------+----------+--------------+ POP      Full           Yes      Yes                                 +---------+---------------+---------+-----------+----------+--------------+ PTV      Full                                                        +---------+---------------+---------+-----------+----------+--------------+ PERO     Full                                                        +---------+---------------+---------+-----------+----------+--------------+     Summary: RIGHT: - There is no evidence of deep vein thrombosis in the lower extremity.  - No cystic structure found in the popliteal fossa.  LEFT: - There is no evidence of deep vein thrombosis in the lower extremity.  - No cystic structure found in the popliteal fossa.  *See table(s) above for measurements and observations. Electronically signed by Waverly Ferrari MD on 12/13/2019 at 3:14:19 PM.    Final    US Abdomen Limited RUQ (LIVER/GB)  Result Date: 12/13/2019 CLINICAL DATA:  Elevated LFTs. EXAM: ULTRASOUND ABDOMEN LIMITED RIGHT UPPER QUADRANT COMPARISON:  None. No prior. FINDINGS: Gallbladder: No gallstones or wall thickening  visualized. No sonographic Murphy sign noted by sonographer. Common bile duct: Diameter: 4.6 mm Liver: Increased echogenicity consistent fatty infiltration or hepatocellular disease. 2.4 x 1.9 x 2.1 cm rounded hypoechoic focus noted in the right hepatic lobe. Although this could represent focal fatty sparing a true hepatic lesion cannot be excluded and gadolinium-enhanced MRI of the abdomen is suggested for further evaluation. Portal vein is patent with normal direction of blood flow. Other: None. IMPRESSION: 1. No gallstones or biliary distention. 2. Increased echogenicity of the liver consistent fatty infiltration or hepatocellular disease. 2.4 x 1.9 x 2.1 cm rounded hypoechoic focus noted in the right hepatic lobe. Although this could represent focal fatty sparing a true hepatic lesion cannot be excluded and gadolinium-enhanced MRI of the abdomen is suggested for further evaluation. Electronically Signed   By: Maisie Fus  Register   On: 12/13/2019 08:39     Subjective: - no chest pain, shortness of breath, no abdominal pain, nausea or vomiting.   Discharge Exam: BP 109/74 (BP Location: Right Arm)   Pulse 63   Temp 98.5 F (36.9 C) (Axillary)   Resp 18   Ht  (1.88 m)   Wt (!) 143.8 kg   SpO2 94%   BMI 40.70 kg/m   General: Pt is alert, awake, not in acute distress Cardiovascular: RRR, S1/S2 +, no rubs, no gallops Respiratory: CTA bilaterally, no wheezing, no rhonchi Abdominal: Soft,  NT, ND, bowel sounds + Extremities: no edema, no cyanosis    The results of significant diagnostics from this hospitalization (including imaging, microbiology, ancillary and laboratory) are listed below for reference.     Microbiology: Recent Results (from the past 240 hour(s))  Blood Culture (routine x 2)     Status: None   Collection Time: 12/09/19  8:08 AM   Specimen: BLOOD  Result Value Ref Range Status   Specimen Description   Final    BLOOD RIGHT ANTECUBITAL Performed at [facility], [address], [place], [place] [address]    Special Requests   Final    BOTTLES DRAWN AEROBIC AND ANAEROBIC Blood Culture adequate volume Performed at [facility], [address], [place], [place] [address]    Culture   Final    NO GROWTH 5 DAYS Performed at [facility], [address], [place], [place] [address]    Report Status 12/14/2019 FINAL  Final  Blood Culture (routine x 2)     Status: None   Collection Time: 12/09/19  8:13 AM   Specimen: BLOOD  Result Value Ref Range Status   Specimen Description   Final    BLOOD LEFT ANTECUBITAL Performed at [facility], [address], [place], [place] [address]    Special Requests   Final    BOTTLES DRAWN AEROBIC AND ANAEROBIC Blood Culture results may not be optimal due to an excessive volume of blood received in culture bottles Performed at [facility] [address] [place], [place] [address]    Culture   Final    NO GROWTH 5 DAYS Performed at [facility], [address], [place], [place] [address]    Report Status 12/14/2019 FINAL  Final  Resp Panel by RT-PCR (Flu A&B, Covid) Nasopharyngeal Swab     Status: Abnormal   Collection Time: 12/09/19  8:59 AM   Specimen: Nasopharyngeal Swab; Nasopharyngeal(NP) swabs in vial transport medium  Result Value Ref Range Status   SARS Coronavirus 2 by RT PCR POSITIVE (A) NEGATIVE Final    Comment: CRITICAL RESULT CALLED TO, READ BACK BY AND VERIFIED WITH: SIMSON,C @ 1412 12/09/2019 PARSONL (NOTE) SARS-CoV-2 target nucleic acids are DETECTED.  The SARS-CoV-2 RNA is generally detectable in upper respiratory specimens during the acute phase of infection. Positive results are indicative of the presence of the identified virus, but do not rule out bacterial infection or co-infection with other pathogens not detected by the test. Clinical correlation with patient history and other diagnostic  information is necessary to determine patient infection status. The expected result is Negative.  Fact Sheet for Patients: BloggerCourse.com  Fact Sheet for Healthcare Providers: SeriousBroker.it  This test is not yet approved or cleared by the [place] FDA and  has been authorized for detection and/or diagnosis of SARS-CoV-2 by FDA under an Emergency Use Authorization (EUA).  This EUA will remain in effect (meaning this t est can be used) for the duration of  the COVID-19 declaration under Section 564(b)(1) of the Act, 21 U.S.C. section 360bbb-3(b)(1), unless the authorization is terminated or revoked sooner.     Influenza A by PCR NEGATIVE NEGATIVE Final   Influenza B by PCR NEGATIVE NEGATIVE Final    Comment: (NOTE) The Xpert Xpress SARS-CoV-2/FLU/RSV plus assay is intended as an aid in the diagnosis of influenza from Nasopharyngeal swab specimens and should not be used as a sole basis for treatment. Nasal washings and  aspirates are unacceptable for Xpert Xpress SARS-CoV-2/FLU/RSV testing.  Fact Sheet for Patients: BloggerCourse.com  Fact Sheet for Healthcare Providers: SeriousBroker.it  This test is not yet approved or cleared by the [place] FDA and has been authorized for detection and/or diagnosis of SARS-CoV-2 by FDA under an Emergency Use Authorization (EUA). This EUA will remain in effect (meaning this test can be used) for the duration of the COVID-19 declaration under Section 564(b)(1) of the Act, 21 U.S.C. section 360bbb-3(b)(1), unless the authorization is terminated or revoked.  Performed at [facility], [address], [place], [place] [address]      Labs: Basic Metabolic Panel: Recent Labs  Lab 12/10/19 0442 12/10/19 0442 12/11/19 0334 12/12/19 0432 12/13/19 0401 12/14/19 0455 12/16/19 0426  NA 134*  --  134* 132*  133* 132*  --   K 4.9  --  5.0 5.1 4.8 5.0  --   CL 100  --  98 96* 97* 95*  --   CO2 25  --  --   GLUCOSE 178*  --  191* 181* 181* 195*  --   BUN 15  --  17 20 22* 22*  --   CREATININE 0.91   < > 0.88 0.85 0.82 0.75 0.78  CALCIUM 7.9*  --  8.1* 8.1* 7.9* 8.1*  --   MG 2.3  --  2.4  --   --   --   --   PHOS 3.1  --  3.6  --   --   --   --    < > = values in this interval not displayed.   Liver Function Tests: Recent Labs  Lab 12/10/19 0442 12/11/19 0334 12/12/19 0432 12/13/19 0401 12/14/19 0455  AST 220* 148* 168* 205* 125*  ALT 151* 128* 191* 318* 294*  ALKPHOS 50 45 46 43 49  BILITOT 0.7 0.6 0.8 0.9 1.0  PROT 6.7 6.7 6.7 6.6 6.8  ALBUMIN 3.5 3.4* 3.4* 3.4* 3.7   CBC: Recent Labs  Lab 12/10/19 0442 12/11/19 0334  WBC 4.0 8.9  NEUTROABS 2.8 7.0  HGB 15.0 14.7  HCT 43.8 42.7  MCV 92.2 92.2  PLT 172 183   CBG: Recent Labs  Lab 12/15/19 0729 12/15/19 1147 12/15/19 1644 12/15/19 2001 12/16/19 0747  GLUCAP 121* 167* 168* 155* 154*   Hgb A1c No results for input(s): HGBA1C in the last 72 hours. Lipid Profile No results for input(s): CHOL, HDL, LDLCALC, TRIG, CHOLHDL, LDLDIRECT in the last 72 hours. Thyroid function studies No results for input(s): TSH, T4TOTAL, T3FREE, THYROIDAB in the last 72 hours.  Invalid input(s): FREET3 Urinalysis No results found for: COLORURINE, APPEARANCEUR, LABSPEC, PHURINE, GLUCOSEU, HGBUR, BILIRUBINUR, KETONESUR, PROTEINUR, UROBILINOGEN, NITRITE, LEUKOCYTESUR  FURTHER DISCHARGE INSTRUCTIONS:   Get Medicines reviewed and adjusted: Please take all your medications with you for your next visit with your Primary MD   Laboratory/radiological data: Please request your Primary MD to go over all hospital tests and procedure/radiological results at the follow up, please ask your Primary MD to get all Hospital records sent to his/her office.   In some cases, they will be blood work, cultures and biopsy results pending at  the time of your discharge. Please request that your primary care M.D. goes through all the records of your hospital data and follows up on these results.   Also Note the following: If you experience worsening of your admission symptoms, develop shortness of breath, life threatening emergency, suicidal or  homicidal thoughts you must seek medical attention immediately by calling 911 or calling your MD immediately  if symptoms less severe.   You must read complete instructions/literature along with all the possible adverse reactions/side effects for all the Medicines you take and that have been prescribed to you. Take any new Medicines after you have completely understood and accpet all the possible adverse reactions/side effects.    Do not drive when taking Pain medications or sleeping medications (Benzodaizepines)   Do not take more than prescribed Pain, Sleep and Anxiety Medications. It is not advisable to combine anxiety,sleep and pain medications without talking with your primary care practitioner   Special Instructions: If you have smoked or chewed Tobacco  in the last 2 yrs please stop smoking, stop any regular Alcohol  and or any Recreational drug use.   Wear Seat belts while driving.   Please note: You were cared for by a hospitalist during your hospital stay. Once you are discharged, your primary care physician will handle any further medical issues. Please note that NO REFILLS for any discharge medications will be authorized once you are discharged, as it is imperative that you return to your primary care physician (or establish a relationship with a primary care physician if you do not have one) for your post hospital discharge needs so that they can reassess your need for medications and monitor your lab values.  Time coordinating discharge: 40 minutes  SIGNED:  Pamella Pert, MD, PhD 12/16/2019, 9:36 AM

## 2019-12-16 NOTE — Plan of Care (Signed)
°  Problem: Education: Goal: Knowledge of risk factors and measures for prevention of condition will improve Outcome: Progressing   Problem: Respiratory: Goal: Will maintain a patent airway Outcome: Progressing Goal: Complications related to the disease process, condition or treatment will be avoided or minimized Outcome: Progressing   Problem: Education: Goal: Knowledge of risk factors and measures for prevention of condition will improve Outcome: Progressing   Problem: Coping: Goal: Psychosocial and spiritual needs will be supported Outcome: Progressing   Problem: Respiratory: Goal: Will maintain a patent airway Outcome: Progressing Goal: Complications related to the disease process, condition or treatment will be avoided or minimized Outcome: Progressing   Problem: Education: Goal: Knowledge of General Education information will improve Description: Including pain rating scale, medication(s)/side effects and non-pharmacologic comfort measures Outcome: Progressing   Problem: Health Behavior/Discharge Planning: Goal: Ability to manage health-related needs will improve Outcome: Progressing   Problem: Clinical Measurements: Goal: Ability to maintain clinical measurements within normal limits will improve Outcome: Progressing Goal: Will remain free from infection Outcome: Progressing Goal: Diagnostic test results will improve Outcome: Progressing Goal: Respiratory complications will improve Outcome: Progressing Goal: Cardiovascular complication will be avoided Outcome: Progressing   Problem: Activity: Goal: Risk for activity intolerance will decrease Outcome: Progressing   Problem: Nutrition: Goal: Adequate nutrition will be maintained Outcome: Progressing   Problem: Coping: Goal: Level of anxiety will decrease Outcome: Progressing   Problem: Elimination: Goal: Will not experience complications related to bowel motility Outcome: Progressing Goal: Will not  experience complications related to urinary retention Outcome: Progressing   Problem: Pain Managment: Goal: General experience of comfort will improve Outcome: Progressing   Problem: Safety: Goal: Ability to remain free from injury will improve Outcome: Progressing   Problem: Skin Integrity: Goal: Risk for impaired skin integrity will decrease Outcome: Progressing

## 2019-12-16 NOTE — TOC Progression Note (Signed)
Transition of Care [facility]) - Progression Note    Patient Details  Name: Kenneth [name] MRN: [id number] Date of Birth: Dec 23, 1975  Transition of [facility]) CM/SW Contact  Golda Acre, RN Phone Number: 12/16/2019, 9:36 AM  Clinical Narrative:    Home o2 ordered through [employer] will deliver portable o2 to the room, call the home to set up home o2.        Expected Discharge Plan and Services                                                 Social Determinants of Health (SDOH) Interventions    Readmission Risk Interventions No flowsheet data found.

## 2019-12-28 ENCOUNTER — Other Ambulatory Visit: Payer: Self-pay | Admitting: Nurse Practitioner

## 2019-12-28 DIAGNOSIS — R932 Abnormal findings on diagnostic imaging of liver and biliary tract: Secondary | ICD-10-CM

## 2020-01-09 ENCOUNTER — Other Ambulatory Visit: Payer: Self-pay

## 2020-01-09 ENCOUNTER — Ambulatory Visit (HOSPITAL_COMMUNITY)
Admission: RE | Admit: 2020-01-09 | Discharge: 2020-01-09 | Disposition: A | Discharge status: Home / Self Care | Payer: 59 | Source: Ambulatory Visit | Attending: Nurse Practitioner | Admitting: Nurse Practitioner

## 2020-01-09 DIAGNOSIS — R932 Abnormal findings on diagnostic imaging of liver and biliary tract: Secondary | ICD-10-CM | POA: Diagnosis not present

## 2020-01-09 MED ORDER — GADOBUTROL 1 MMOL/ML IV SOLN
10.0000 mL | Freq: Once | INTRAVENOUS | Status: AC | PRN
  Administered 2020-01-09: 10 mL via INTRAVENOUS

## 2021-02-27 IMAGING — MR MR ABDOMEN WO/W CM
18 series · 48 of 48 positions shown · IV contrast (Contrast agent)
Comparison: Right upper quadrant ultrasound dated 12/13/2019

CLINICAL DATA: Abnormal liver ultrasound

EXAM:
MRI ABDOMEN WITHOUT AND WITH CONTRAST
TECHNIQUE: Multiplanar multisequence MR imaging of the abdomen was performed
both before and after the administration of intravenous contrast.
CONTRAST:  10mL GADAVIST GADOBUTROL 1 MMOL/ML IV SOLN

[Series 4: cor haste · coronal · 6.0mm · 1.56mm/px · 2 of 38 slices shown]
[im 1/38]
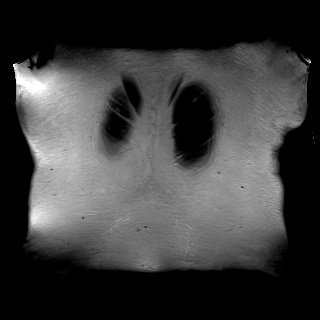
[im 38/38]
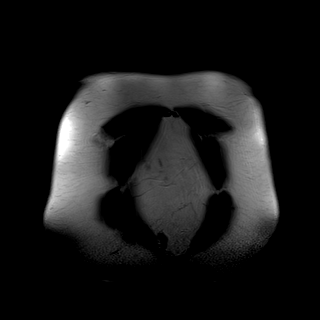

[Series 5: ax haste · axial · 6.0mm · 1.50mm/px · z∈[-202,+93]mm · 2 of 42 slices shown]
[im 1/42]
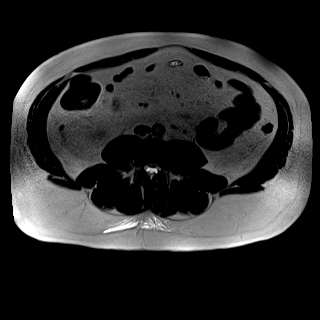
[im 42/42]
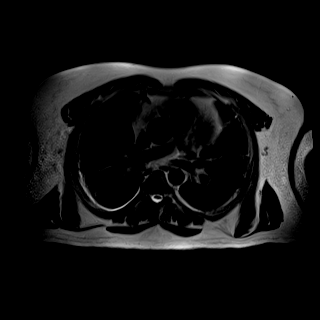

[Series 7: T2 fat-sat · axial · 6.0mm · 1.50mm/px · z∈[-202,+93]mm · 2 of 42 slices shown]
[im 1/42]
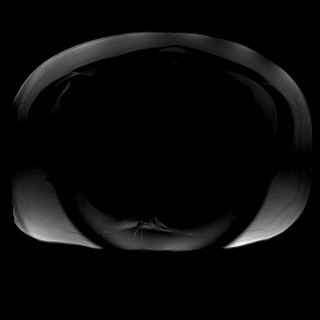
[im 42/42]
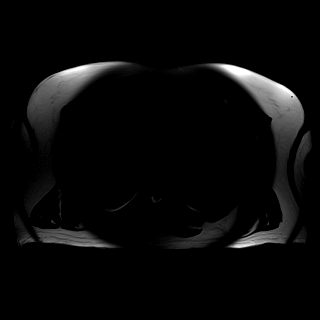

[Series 9: t1_vibe_opp-in_tra_p4_bh · axial · 3.0mm · 1.50mm/px · z∈[-192,+93]mm · 3 of 96 slices shown (1 of 2)]
[im 1/96]
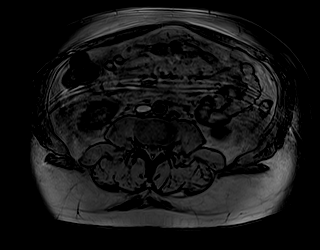
[im 48/96]
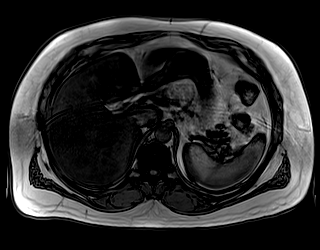
[im 96/96]
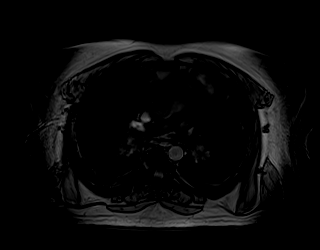

[Series 9: t1_vibe_opp-in_tra_p4_bh · axial · 3.0mm · 1.50mm/px · z∈[-192,+93]mm · 3 of 96 slices shown (2 of 2)]
[im 1/96]
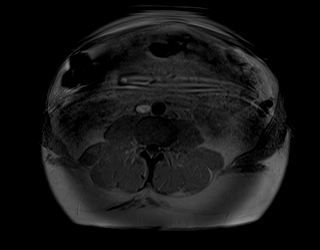
[im 48/96]
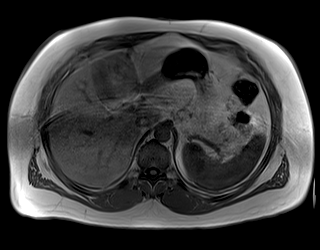
[im 96/96]
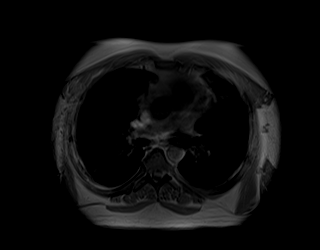

[Series 10: DWI · axial · 6.0mm · 1.79mm/px · z∈[-202,+93]mm · 4 of 126 slices shown (1 of 2)]
[im 1/126]
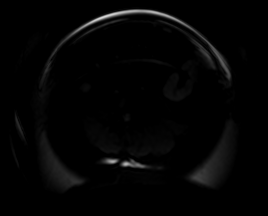
[im 42/126]
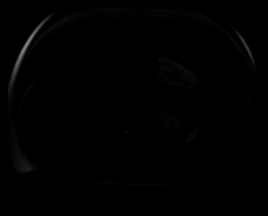
[im 84/126]
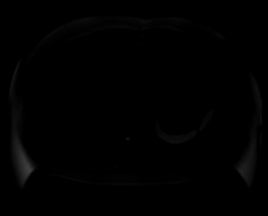
[im 126/126]
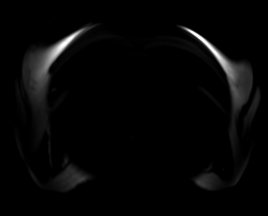

[Series 11: DWI · axial · 6.0mm · 1.79mm/px · 1 of 42 slices shown (2 of 2)]
[im 1/42]
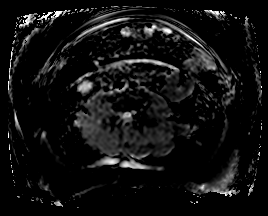

[Series 12: bSSFP · axial · 6.0mm · 0.94mm/px · 1 of 42 slices shown]
[im 1/42]
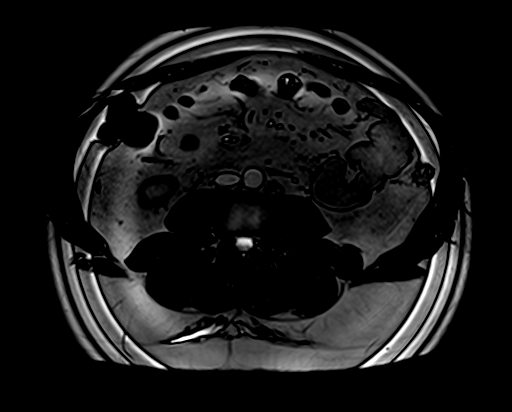

[Series 13: t1_vibe_fs_tra_p4_bh_pre · axial · 3.0mm · 1.50mm/px · z∈[-195,+90]mm · 3 of 96 slices shown]
[im 1/96]
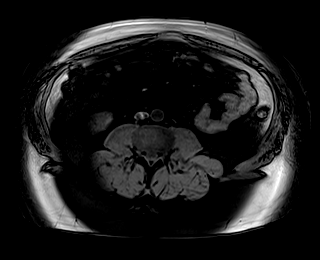
[im 48/96]
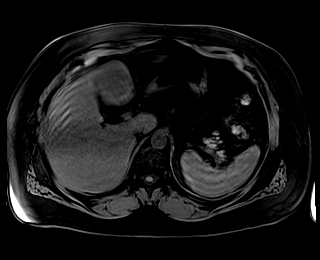
[im 96/96]
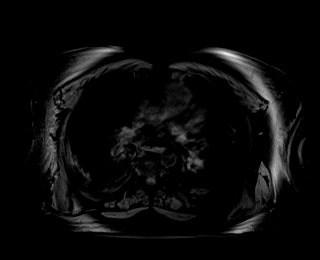

[Series 15: t1_vibe_fs_tra_p4_bh_post · axial · 3.0mm · 1.50mm/px · z∈[-195,+90]mm · 3 of 96 slices shown (1 of 4)]
[im 1/96]
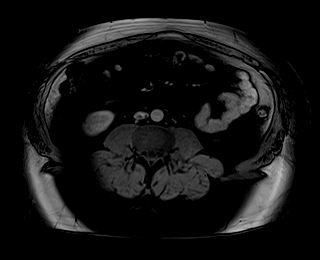
[im 48/96]
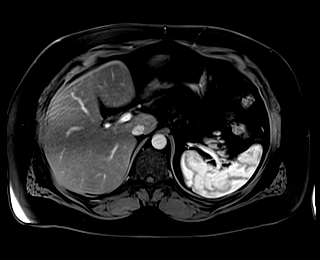
[im 96/96]
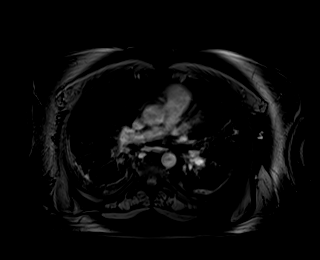

[Series 16: t1_vibe_fs_tra_p4_bh_post_sub · axial · 3.0mm · 1.50mm/px · z∈[-195,+90]mm · 3 of 96 slices shown (1 of 4)]
[im 1/96]
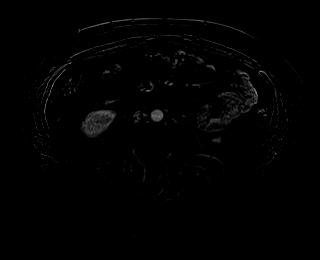
[im 48/96]
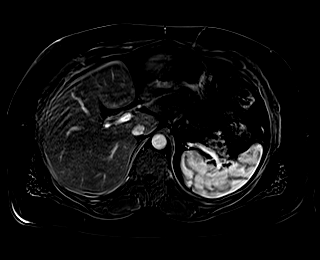
[im 96/96]
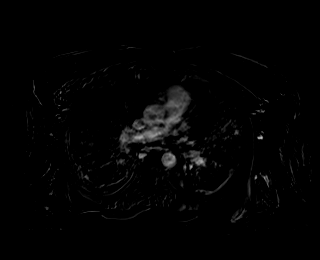

[Series 17: t1_vibe_fs_tra_p4_bh_post · axial · 3.0mm · 1.50mm/px · z∈[-195,+90]mm · 3 of 96 slices shown (2 of 4)]
[im 1/96]
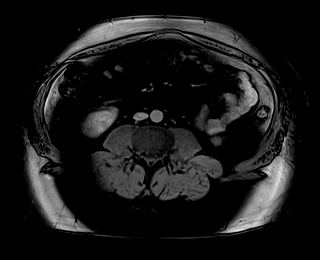
[im 48/96]
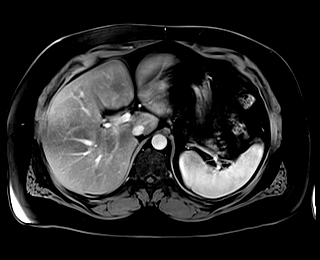
[im 96/96]
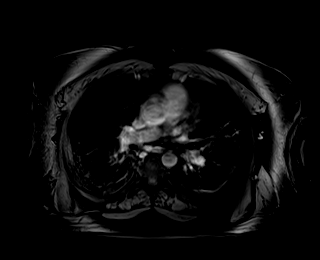

[Series 18: t1_vibe_fs_tra_p4_bh_post_sub · axial · 3.0mm · 1.50mm/px · z∈[-195,+90]mm · 3 of 96 slices shown (2 of 4)]
[im 1/96]
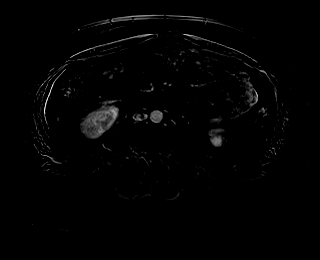
[im 48/96]
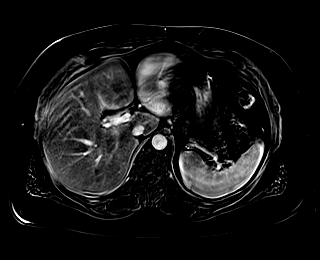
[im 96/96]
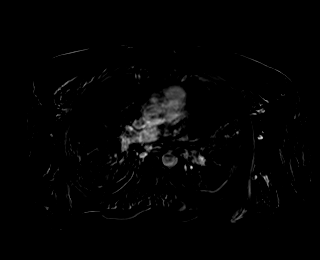

[Series 19: t1_vibe_fs_tra_p4_bh_post · axial · 3.0mm · 1.50mm/px · z∈[-195,+90]mm · 3 of 96 slices shown (3 of 4)]
[im 1/96]
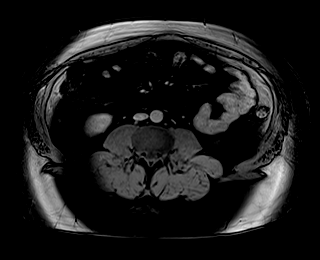
[im 48/96]
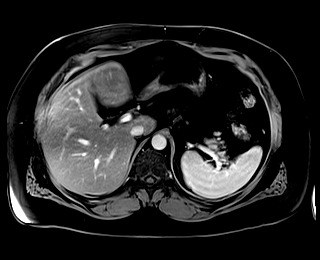
[im 96/96]
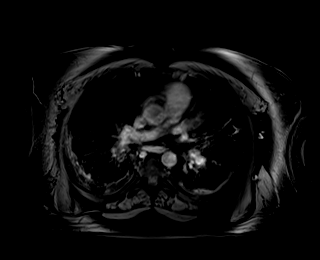

[Series 20: t1_vibe_fs_tra_p4_bh_post_sub · axial · 3.0mm · 1.50mm/px · z∈[-195,+90]mm · 3 of 96 slices shown (3 of 4)]
[im 1/96]
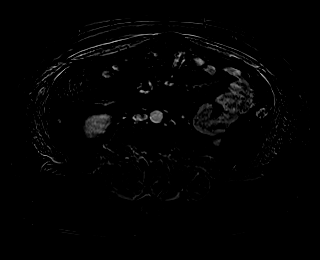
[im 48/96]
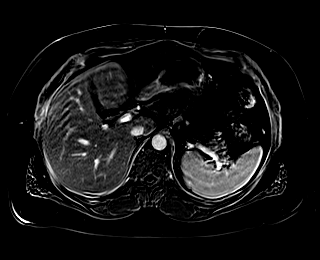
[im 96/96]
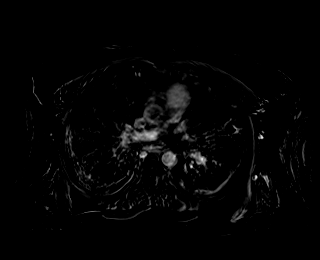

[Series 21: t1_vibe_fs_tra_p4_bh_post · axial · 3.0mm · 1.50mm/px · z∈[-195,+90]mm · 3 of 96 slices shown (4 of 4)]
[im 1/96]
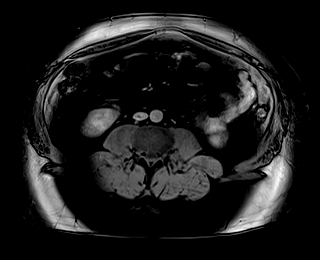
[im 48/96]
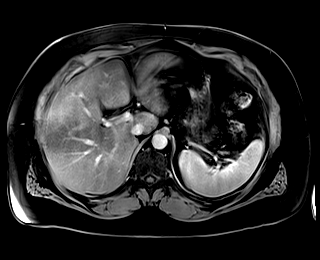
[im 96/96]
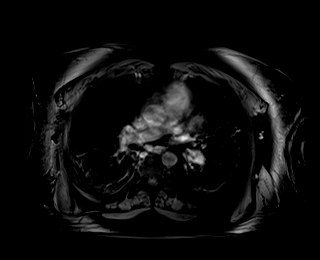

[Series 22: t1_vibe_fs_tra_p4_bh_post_sub · axial · 3.0mm · 1.50mm/px · z∈[-195,+90]mm · 3 of 96 slices shown (4 of 4)]
[im 1/96]
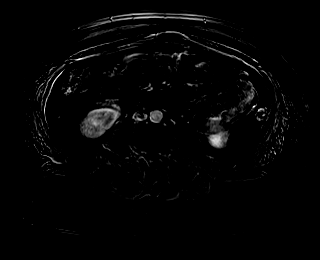
[im 48/96]
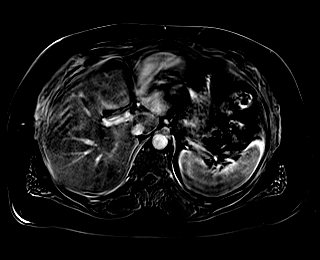
[im 96/96]
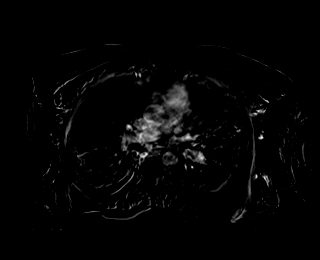

[Series 23: T1 dynamic post-contrast · coronal · 3.0mm · 1.56mm/px · 3 of 96 slices shown]
[im 1/96]
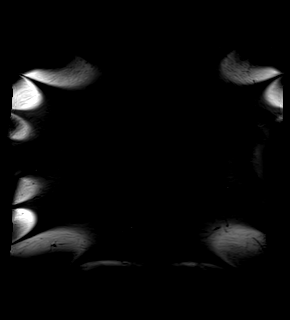
[im 48/96]
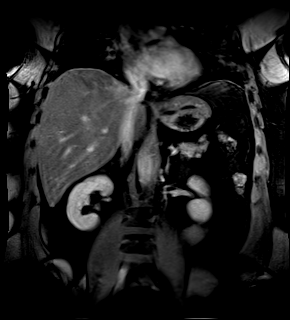
[im 96/96]
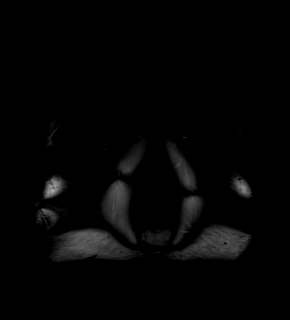

[48 of 48 positions shown; findings below may reference images not displayed]

FINDINGS: Lower chest: Trace bilateral pleural effusions. Mild patchy
opacities at the lung bases, right greater than left, atelectasis
versus pneumonia.

Hepatobiliary: No morphologic findings of cirrhosis. Moderate
hepatic steatosis. 2.4 x 2.2 cm rounded enhancing lesion in segment
5 (series 15/image 64), with associated mild T2 hyperintensity
(series 7/image 26). Corresponding focal fatty sparing on opposed
phase imaging (series 9/image 62), suggesting that there is no
intracellular lipid within the lesion. Overall appearance favors
FNH.

Gallbladder is unremarkable. No intrahepatic or extrahepatic ductal
dilatation.

Pancreas:  Within normal limits.

Spleen:  Within normal limits.

Adrenals/Urinary Tract:  Adrenal glands are within normal limits.

Kidneys are within normal limits.  No hydronephrosis.

Stomach/Bowel: Stomach is within normal limits.

Visualized bowel is unremarkable.

Vascular/Lymphatic:  No evidence of abdominal aortic aneurysm.

No suspicious abdominal lymphadenopathy.

Other:  No abdominal ascites.

Musculoskeletal: No focal osseous lesions.
IMPRESSION: 2.4 cm enhancing lesion in segment 5 of the liver, favoring benign
FNH. Consider follow-up MRI abdomen with/without contrast in 6
months.

Underlying moderate hepatic steatosis.

Mild patchy opacities at the lung bases, right greater than left,
atelectasis versus pneumonia. Trace bilateral pleural effusions.

## 2021-07-04 ENCOUNTER — Other Ambulatory Visit: Payer: Self-pay | Admitting: Neurosurgery

## 2021-07-04 DIAGNOSIS — G8929 Other chronic pain: Secondary | ICD-10-CM

## 2021-07-15 ENCOUNTER — Ambulatory Visit
Admission: RE | Admit: 2021-07-15 | Discharge: 2021-07-15 | Disposition: A | Discharge status: Home / Self Care | Payer: 59 | Source: Ambulatory Visit | Attending: Neurosurgery | Admitting: Neurosurgery

## 2021-07-15 DIAGNOSIS — G8929 Other chronic pain: Secondary | ICD-10-CM

## 2021-07-15 MED ORDER — IOPAMIDOL (ISOVUE-M 200) INJECTION 41%
20.0000 mL | Freq: Once | INTRAMUSCULAR | Status: AC
  Administered 2021-07-15: 20 mL via INTRATHECAL

## 2021-07-15 MED ORDER — MEPERIDINE HCL 50 MG/ML IJ SOLN: 50.0000 mg | Freq: Once | INTRAMUSCULAR | Status: DC | PRN

## 2021-07-15 MED ORDER — ONDANSETRON HCL 4 MG/2ML IJ SOLN: 4.0000 mg | Freq: Once | INTRAMUSCULAR | Status: DC | PRN

## 2021-07-15 MED ORDER — DIAZEPAM 5 MG PO TABS
10.0000 mg | ORAL_TABLET | Freq: Once | ORAL | Status: AC
  Administered 2021-07-15: 10 mg via ORAL

## 2021-11-22 ENCOUNTER — Other Ambulatory Visit: Payer: Self-pay | Admitting: Neurosurgery

## 2021-11-28 ENCOUNTER — Other Ambulatory Visit: Payer: Self-pay | Admitting: Neurosurgery

## 2021-12-31 NOTE — Pre-Procedure Instructions (Signed)
Surgical Instructions    Your procedure is scheduled on Thursday, December 21st.  Report to [facility] Main Entrance "A" at 5:30 A.M., then check in with the Admitting office.  Call this number if you have problems the morning of surgery:  [phone]   If you have any questions prior to your surgery date call [phone]: Open Monday-Friday 8am-4pm    Remember:  Do not eat or drink after midnight the night before your surgery    Take these medicines the morning of surgery with A SIP OF WATER  pantoprazole (PROTONIX)  tamsulosin (FLOMAX)  fluticasone (FLONASE)  cetirizine (ZYRTEC) -as needed Albuterol inhaler-as needed; Please bring all inhalers with you the day of surgery.   As of today, STOP taking any Aspirin (unless otherwise instructed by your surgeon) Aleve, Naproxen, Ibuprofen, Motrin, Advil, Goody's, BC's, all herbal medications, fish oil, and all vitamins.                     Do NOT Smoke (Tobacco/Vaping) for 24 hours prior to your procedure.  If you use a CPAP at night, you may bring your mask/headgear for your overnight stay.   Contacts, glasses, piercing's, hearing aid's, dentures or partials may not be worn into surgery, please bring cases for these belongings.    For patients admitted to the hospital, discharge time will be determined by your treatment team.   Patients discharged the day of surgery will not be allowed to drive home, and someone needs to stay with them for 24 hours.  SURGICAL WAITING ROOM VISITATION Patients having surgery or a procedure may have no more than 2 support people in the waiting area - these visitors may rotate.   Children under the age of 55 must have an adult with them who is not the patient. If the patient needs to stay at the hospital during part of their recovery, the visitor guidelines for inpatient rooms apply. Pre-op nurse will coordinate an appropriate time for 1 support person to accompany patient in pre-op.  This support  person may not rotate.   Please refer to the [facility] website for the visitor guidelines for Inpatients (after your surgery is over and you are in a regular room).    Special instructions:   [place]- Preparing For Surgery  Before surgery, you can play an important role. Because skin is not sterile, your skin needs to be as free of germs as possible. You can reduce the number of germs on your skin by washing with CHG (chlorahexidine gluconate) Soap before surgery.  CHG is an antiseptic cleaner which kills germs and bonds with the skin to continue killing germs even after washing.    Oral Hygiene is also important to reduce your risk of infection.  Remember - BRUSH YOUR TEETH THE MORNING OF SURGERY WITH YOUR REGULAR TOOTHPASTE  Please do not use if you have an allergy to CHG or antibacterial soaps. If your skin becomes reddened/irritated stop using the CHG.  Do not shave (including legs and underarms) for at least 48 hours prior to first CHG shower. It is OK to shave your face.  Please follow these instructions carefully.   Shower the NIGHT BEFORE SURGERY and the MORNING OF SURGERY  If you chose to wash your hair, wash your hair first as usual with your normal shampoo.  After you shampoo, rinse your hair and body thoroughly to remove the shampoo.  Use CHG Soap as you would any other liquid soap. You can apply  CHG directly to the skin and wash gently with a scrungie or a clean washcloth.   Apply the CHG Soap to your body ONLY FROM THE NECK DOWN.  Do not use on open wounds or open sores. Avoid contact with your eyes, ears, mouth and genitals (private parts). Wash Face and genitals (private parts)  with your normal soap.   Wash thoroughly, paying special attention to the area where your surgery will be performed.  Thoroughly rinse your body with warm water from the neck down.  DO NOT shower/wash with your normal soap after using and rinsing off the CHG Soap.  Pat yourself dry with a  CLEAN TOWEL.  Wear CLEAN PAJAMAS to bed the night before surgery  Place CLEAN SHEETS on your bed the night before your surgery  DO NOT SLEEP WITH PETS.   Day of Surgery: Take a shower with CHG soap. Do not wear jewelry Do not wear lotions, powders, colognes, or deodorant. Do not shave 48 hours prior to surgery.  Men may shave face and neck. Do not bring valuables to the hospital.  [facility] is not responsible for any belongings or valuables. Wear Clean/Comfortable clothing the morning of surgery Remember to brush your teeth WITH YOUR REGULAR TOOTHPASTE.   Please read over the following fact sheets that you were given.    If you received a COVID test during your pre-op visit  it is requested that you wear a mask when out in public, stay away from anyone that may not be feeling well and notify your surgeon if you develop symptoms. If you have been in contact with anyone that has tested positive in the last 10 days please notify you surgeon.

## 2022-01-01 ENCOUNTER — Encounter (HOSPITAL_COMMUNITY)
Admission: RE | Admit: 2022-01-01 | Discharge: 2022-01-01 | Disposition: A | Discharge status: Home / Self Care | Payer: 59 | Source: Ambulatory Visit | Attending: Neurosurgery | Admitting: Neurosurgery

## 2022-01-01 ENCOUNTER — Encounter (HOSPITAL_COMMUNITY): Payer: Self-pay

## 2022-01-01 ENCOUNTER — Other Ambulatory Visit: Payer: Self-pay

## 2022-01-01 VITALS — BP 142/89 | HR 92 | Temp 97.9°F | Resp 18 | Ht 73.0 in | Wt 337.0 lb

## 2022-01-01 DIAGNOSIS — Z01818 Encounter for other preprocedural examination: Secondary | ICD-10-CM

## 2022-01-01 DIAGNOSIS — Z01812 Encounter for preprocedural laboratory examination: Secondary | ICD-10-CM | POA: Insufficient documentation

## 2022-01-01 LAB — CBC
HCT: 43.4 % (ref 39.0–52.0)
Hemoglobin: 15.6 g/dL (ref 13.0–17.0)
MCH: 32 pg (ref 26.0–34.0)
MCHC: 35.9 g/dL (ref 30.0–36.0)
MCV: 88.9 fL (ref 80.0–100.0)
Platelets: 186 10*3/uL (ref 150–400)
RBC: 4.88 MIL/uL (ref 4.22–5.81)
RDW: 12.5 % (ref 11.5–15.5)
WBC: 8.6 10*3/uL (ref 4.0–10.5)
nRBC: 0 % (ref 0.0–0.2)

## 2022-01-01 LAB — SURGICAL PCR SCREEN
MRSA, PCR: NEGATIVE
Staphylococcus aureus: NEGATIVE

## 2022-01-01 LAB — TYPE AND SCREEN
ABO/RH(D): O POS
Antibody Screen: NEGATIVE

## 2022-01-01 NOTE — Progress Notes (Signed)
PCP - Dr. Antony Haste Cardiologist - Denies  PPM/ICD - Denies Device Orders - n/a Rep Notified - n/a  Chest x-ray - n/a EKG - Had one in 2021 when he was admitted to [facility] with COVID PNA. Not needed one since admission Stress Test - Denies ECHO - Denies Cardiac Cath - Denies  Sleep Study - Yes. Postive OSA around 2015 CPAP - Wears CPAP nightly. Pressure setting 10  No DM  Last dose of GLP1 agonist- n/a GLP1 instructions: n/a  Blood Thinner Instructions: n/a Aspirin Instructions: n/a  NPO after midnight  COVID TEST- n/a   Anesthesia review: No.  Patient denies shortness of breath, fever, cough and chest pain at PAT appointment   All instructions explained to the patient, with a verbal understanding of the material. Patient agrees to go over the instructions while at home for a better understanding. Patient also instructed to self quarantine after being tested for COVID-19. The opportunity to ask questions was provided.

## 2022-01-09 ENCOUNTER — Ambulatory Visit (HOSPITAL_COMMUNITY)
Admission: RE | Disposition: A | Discharge status: Home / Self Care | Payer: Self-pay | Source: Home / Self Care | Attending: Neurosurgery

## 2022-01-09 ENCOUNTER — Ambulatory Visit (HOSPITAL_COMMUNITY): Payer: 59 | Admitting: Certified Registered"

## 2022-01-09 ENCOUNTER — Ambulatory Visit (HOSPITAL_BASED_OUTPATIENT_CLINIC_OR_DEPARTMENT_OTHER): Payer: 59 | Admitting: Certified Registered"

## 2022-01-09 ENCOUNTER — Observation Stay (HOSPITAL_COMMUNITY)
Admission: RE | Admit: 2022-01-09 | Discharge: 2022-01-12 | Disposition: A | Discharge status: Home / Self Care | Payer: 59 | Attending: Neurosurgery | Admitting: Neurosurgery

## 2022-01-09 ENCOUNTER — Ambulatory Visit (HOSPITAL_COMMUNITY): Payer: 59

## 2022-01-09 ENCOUNTER — Encounter (HOSPITAL_COMMUNITY): Payer: Self-pay | Admitting: Neurosurgery

## 2022-01-09 ENCOUNTER — Other Ambulatory Visit: Payer: Self-pay

## 2022-01-09 DIAGNOSIS — M4316 Spondylolisthesis, lumbar region: Secondary | ICD-10-CM

## 2022-01-09 DIAGNOSIS — M4726 Other spondylosis with radiculopathy, lumbar region: Secondary | ICD-10-CM

## 2022-01-09 DIAGNOSIS — M5417 Radiculopathy, lumbosacral region: Secondary | ICD-10-CM | POA: Insufficient documentation

## 2022-01-09 DIAGNOSIS — J189 Pneumonia, unspecified organism: Secondary | ICD-10-CM | POA: Diagnosis not present

## 2022-01-09 DIAGNOSIS — M4307 Spondylolysis, lumbosacral region: Secondary | ICD-10-CM | POA: Diagnosis not present

## 2022-01-09 DIAGNOSIS — Z79899 Other long term (current) drug therapy: Secondary | ICD-10-CM | POA: Diagnosis not present

## 2022-01-09 DIAGNOSIS — M4317 Spondylolisthesis, lumbosacral region: Secondary | ICD-10-CM | POA: Diagnosis present

## 2022-01-09 DIAGNOSIS — M48062 Spinal stenosis, lumbar region with neurogenic claudication: Secondary | ICD-10-CM | POA: Insufficient documentation

## 2022-01-09 DIAGNOSIS — J45909 Unspecified asthma, uncomplicated: Secondary | ICD-10-CM | POA: Diagnosis not present

## 2022-01-09 LAB — ABO/RH: ABO/RH(D): O POS

## 2022-01-09 SURGERY — POSTERIOR LUMBAR FUSION 2 LEVEL
Anesthesia: General | Site: Spine Lumbar

## 2022-01-09 MED ORDER — HYDROMORPHONE HCL 1 MG/ML IJ SOLN
INTRAMUSCULAR | Status: AC
  Filled 2022-01-09: qty 0.5

## 2022-01-09 MED ORDER — FENTANYL CITRATE (PF) 250 MCG/5ML IJ SOLN
INTRAMUSCULAR | Status: DC | PRN
  Administered 2022-01-09: 50 ug via INTRAVENOUS
  Administered 2022-01-09: 100 ug via INTRAVENOUS
  Administered 2022-01-09 (×2): 50 ug via INTRAVENOUS

## 2022-01-09 MED ORDER — SODIUM CHLORIDE 0.9 % IV SOLN: INTRAVENOUS | Status: DC | PRN

## 2022-01-09 MED ORDER — MORPHINE SULFATE (PF) 4 MG/ML IV SOLN
4.0000 mg | INTRAVENOUS | Status: DC | PRN
  Administered 2022-01-11 (×2): 4 mg via INTRAVENOUS
  Filled 2022-01-09 (×3): qty 1

## 2022-01-09 MED ORDER — ORAL CARE MOUTH RINSE: 15.0000 mL | Freq: Once | OROMUCOSAL | Status: AC

## 2022-01-09 MED ORDER — ONDANSETRON HCL 4 MG/2ML IJ SOLN
INTRAMUSCULAR | Status: DC | PRN
  Administered 2022-01-09: 4 mg via INTRAVENOUS

## 2022-01-09 MED ORDER — DEXMEDETOMIDINE HCL IN NACL 80 MCG/20ML IV SOLN
INTRAVENOUS | Status: DC | PRN
  Administered 2022-01-09: 4 ug via BUCCAL
  Administered 2022-01-09 (×2): 8 ug via BUCCAL

## 2022-01-09 MED ORDER — LACTATED RINGERS IV SOLN: INTRAVENOUS | Status: DC

## 2022-01-09 MED ORDER — PANTOPRAZOLE SODIUM 40 MG PO TBEC
40.0000 mg | DELAYED_RELEASE_TABLET | Freq: Every day | ORAL | Status: DC
  Administered 2022-01-10 – 2022-01-12 (×3): 40 mg via ORAL
  Filled 2022-01-09 (×3): qty 1

## 2022-01-09 MED ORDER — MENTHOL 3 MG MT LOZG: 1.0000 | LOZENGE | OROMUCOSAL | Status: DC | PRN

## 2022-01-09 MED ORDER — FENTANYL CITRATE (PF) 100 MCG/2ML IJ SOLN: 25.0000 ug | INTRAMUSCULAR | Status: DC | PRN

## 2022-01-09 MED ORDER — CYCLOBENZAPRINE HCL 10 MG PO TABS
ORAL_TABLET | ORAL | Status: AC
  Filled 2022-01-09: qty 1

## 2022-01-09 MED ORDER — SODIUM CHLORIDE 0.9% FLUSH
3.0000 mL | Freq: Two times a day (BID) | INTRAVENOUS | Status: DC
  Administered 2022-01-09 – 2022-01-12 (×5): 3 mL via INTRAVENOUS

## 2022-01-09 MED ORDER — PHENYLEPHRINE 80 MCG/ML (10ML) SYRINGE FOR IV PUSH (FOR BLOOD PRESSURE SUPPORT)
PREFILLED_SYRINGE | INTRAVENOUS | Status: DC | PRN
  Administered 2022-01-09: 160 ug via INTRAVENOUS

## 2022-01-09 MED ORDER — PHENYLEPHRINE 80 MCG/ML (10ML) SYRINGE FOR IV PUSH (FOR BLOOD PRESSURE SUPPORT)
PREFILLED_SYRINGE | INTRAVENOUS | Status: AC
  Filled 2022-01-09: qty 10

## 2022-01-09 MED ORDER — DIPHENHYDRAMINE HCL 50 MG/ML IJ SOLN
INTRAMUSCULAR | Status: DC | PRN
  Administered 2022-01-09: 12.5 mg via INTRAVENOUS

## 2022-01-09 MED ORDER — CEFAZOLIN SODIUM-DEXTROSE 2-4 GM/100ML-% IV SOLN
2.0000 g | Freq: Three times a day (TID) | INTRAVENOUS | Status: AC
  Administered 2022-01-09 – 2022-01-10 (×2): 2 g via INTRAVENOUS
  Filled 2022-01-09 (×2): qty 100

## 2022-01-09 MED ORDER — THROMBIN 5000 UNITS EX SOLR
OROMUCOSAL | Status: DC | PRN
  Administered 2022-01-09: 5 mL via TOPICAL

## 2022-01-09 MED ORDER — LORATADINE 10 MG PO TABS
10.0000 mg | ORAL_TABLET | Freq: Every day | ORAL | Status: DC
  Administered 2022-01-10 – 2022-01-12 (×3): 10 mg via ORAL
  Filled 2022-01-09 (×3): qty 1

## 2022-01-09 MED ORDER — CYCLOBENZAPRINE HCL 10 MG PO TABS
10.0000 mg | ORAL_TABLET | Freq: Three times a day (TID) | ORAL | Status: DC | PRN
  Administered 2022-01-09 – 2022-01-12 (×7): 10 mg via ORAL
  Filled 2022-01-09 (×6): qty 1

## 2022-01-09 MED ORDER — SUCCINYLCHOLINE CHLORIDE 200 MG/10ML IV SOSY
PREFILLED_SYRINGE | INTRAVENOUS | Status: AC
  Filled 2022-01-09: qty 10

## 2022-01-09 MED ORDER — THROMBIN 5000 UNITS EX SOLR
CUTANEOUS | Status: AC
  Filled 2022-01-09: qty 5000

## 2022-01-09 MED ORDER — DOCUSATE SODIUM 100 MG PO CAPS
100.0000 mg | ORAL_CAPSULE | Freq: Two times a day (BID) | ORAL | Status: DC
  Administered 2022-01-09 – 2022-01-12 (×6): 100 mg via ORAL
  Filled 2022-01-09 (×6): qty 1

## 2022-01-09 MED ORDER — MIDAZOLAM HCL 2 MG/2ML IJ SOLN
INTRAMUSCULAR | Status: DC | PRN
  Administered 2022-01-09: 2 mg via INTRAVENOUS

## 2022-01-09 MED ORDER — BACITRACIN ZINC 500 UNIT/GM EX OINT
TOPICAL_OINTMENT | CUTANEOUS | Status: AC
  Filled 2022-01-09: qty 28.35

## 2022-01-09 MED ORDER — BUPIVACAINE-EPINEPHRINE (PF) 0.5% -1:200000 IJ SOLN
INTRAMUSCULAR | Status: AC
  Filled 2022-01-09: qty 30

## 2022-01-09 MED ORDER — BUPIVACAINE LIPOSOME 1.3 % IJ SUSP
INTRAMUSCULAR | Status: AC
  Filled 2022-01-09: qty 20

## 2022-01-09 MED ORDER — FENTANYL CITRATE (PF) 250 MCG/5ML IJ SOLN
INTRAMUSCULAR | Status: AC
  Filled 2022-01-09: qty 5

## 2022-01-09 MED ORDER — HYDROMORPHONE HCL 1 MG/ML IJ SOLN
INTRAMUSCULAR | Status: DC | PRN
  Administered 2022-01-09 (×2): .5 mg via INTRAVENOUS

## 2022-01-09 MED ORDER — MIDAZOLAM HCL 2 MG/2ML IJ SOLN
INTRAMUSCULAR | Status: AC
  Filled 2022-01-09: qty 2

## 2022-01-09 MED ORDER — DEXAMETHASONE SODIUM PHOSPHATE 10 MG/ML IJ SOLN
INTRAMUSCULAR | Status: AC
  Filled 2022-01-09: qty 2

## 2022-01-09 MED ORDER — CHLORHEXIDINE GLUCONATE CLOTH 2 % EX PADS: 6.0000 | MEDICATED_PAD | Freq: Once | CUTANEOUS | Status: DC

## 2022-01-09 MED ORDER — BACITRACIN ZINC 500 UNIT/GM EX OINT
TOPICAL_OINTMENT | CUTANEOUS | Status: DC | PRN
  Administered 2022-01-09: 1 via TOPICAL

## 2022-01-09 MED ORDER — 0.9 % SODIUM CHLORIDE (POUR BTL) OPTIME
TOPICAL | Status: DC | PRN
  Administered 2022-01-09 (×2): 1000 mL

## 2022-01-09 MED ORDER — ROSUVASTATIN CALCIUM 20 MG PO TABS
20.0000 mg | ORAL_TABLET | Freq: Every day | ORAL | Status: DC
  Administered 2022-01-10 – 2022-01-11 (×2): 20 mg via ORAL
  Filled 2022-01-09 (×2): qty 1

## 2022-01-09 MED ORDER — CHLORHEXIDINE GLUCONATE 0.12 % MT SOLN
15.0000 mL | Freq: Once | OROMUCOSAL | Status: AC
  Administered 2022-01-09: 15 mL via OROMUCOSAL
  Filled 2022-01-09: qty 15

## 2022-01-09 MED ORDER — PROPOFOL 10 MG/ML IV BOLUS
INTRAVENOUS | Status: AC
  Filled 2022-01-09: qty 20

## 2022-01-09 MED ORDER — BISACODYL 10 MG RE SUPP: 10.0000 mg | Freq: Every day | RECTAL | Status: DC | PRN

## 2022-01-09 MED ORDER — OXYCODONE HCL 5 MG PO TABS
10.0000 mg | ORAL_TABLET | ORAL | Status: DC | PRN
  Administered 2022-01-09 – 2022-01-12 (×9): 10 mg via ORAL
  Filled 2022-01-09 (×9): qty 2

## 2022-01-09 MED ORDER — CEFAZOLIN IN SODIUM CHLORIDE 3-0.9 GM/100ML-% IV SOLN
3.0000 g | INTRAVENOUS | Status: AC
  Administered 2022-01-09 (×2): 3 g via INTRAVENOUS
  Filled 2022-01-09: qty 100

## 2022-01-09 MED ORDER — SODIUM CHLORIDE 0.9 % IV SOLN: 250.0000 mL | INTRAVENOUS | Status: DC

## 2022-01-09 MED ORDER — DEXAMETHASONE SODIUM PHOSPHATE 10 MG/ML IJ SOLN
INTRAMUSCULAR | Status: DC | PRN
  Administered 2022-01-09: 10 mg via INTRAVENOUS

## 2022-01-09 MED ORDER — SUCCINYLCHOLINE CHLORIDE 200 MG/10ML IV SOSY
PREFILLED_SYRINGE | INTRAVENOUS | Status: DC | PRN
  Administered 2022-01-09: 140 mg via INTRAVENOUS

## 2022-01-09 MED ORDER — ONDANSETRON HCL 4 MG/2ML IJ SOLN
INTRAMUSCULAR | Status: AC
  Filled 2022-01-09: qty 4

## 2022-01-09 MED ORDER — ONDANSETRON HCL 4 MG PO TABS: 4.0000 mg | ORAL_TABLET | Freq: Four times a day (QID) | ORAL | Status: DC | PRN

## 2022-01-09 MED ORDER — TAMSULOSIN HCL 0.4 MG PO CAPS
0.4000 mg | ORAL_CAPSULE | Freq: Every day | ORAL | Status: DC
  Administered 2022-01-10 – 2022-01-12 (×3): 0.4 mg via ORAL
  Filled 2022-01-09 (×3): qty 1

## 2022-01-09 MED ORDER — BUPIVACAINE LIPOSOME 1.3 % IJ SUSP
INTRAMUSCULAR | Status: DC | PRN
  Administered 2022-01-09: 20 mL

## 2022-01-09 MED ORDER — ALBUMIN HUMAN 5 % IV SOLN: INTRAVENOUS | Status: DC | PRN

## 2022-01-09 MED ORDER — OXYCODONE HCL 5 MG PO TABS
ORAL_TABLET | ORAL | Status: AC
  Filled 2022-01-09: qty 1

## 2022-01-09 MED ORDER — ACETAMINOPHEN 325 MG PO TABS: 650.0000 mg | ORAL_TABLET | ORAL | Status: DC | PRN

## 2022-01-09 MED ORDER — FLUTICASONE PROPIONATE 50 MCG/ACT NA SUSP
1.0000 | Freq: Every day | NASAL | Status: DC
  Administered 2022-01-09 – 2022-01-12 (×4): 1 via NASAL
  Filled 2022-01-09: qty 16

## 2022-01-09 MED ORDER — PROPOFOL 10 MG/ML IV BOLUS
INTRAVENOUS | Status: DC | PRN
  Administered 2022-01-09: 150 mg via INTRAVENOUS
  Administered 2022-01-09: 70 mg via INTRAVENOUS

## 2022-01-09 MED ORDER — BUPIVACAINE-EPINEPHRINE (PF) 0.5% -1:200000 IJ SOLN
INTRAMUSCULAR | Status: DC | PRN
  Administered 2022-01-09: 10 mL

## 2022-01-09 MED ORDER — ONDANSETRON HCL 4 MG/2ML IJ SOLN
4.0000 mg | Freq: Four times a day (QID) | INTRAMUSCULAR | Status: DC | PRN
  Filled 2022-01-09: qty 2

## 2022-01-09 MED ORDER — ALBUTEROL SULFATE (2.5 MG/3ML) 0.083% IN NEBU: 2.5000 mg | INHALATION_SOLUTION | RESPIRATORY_TRACT | Status: DC | PRN

## 2022-01-09 MED ORDER — OXYCODONE HCL 5 MG PO TABS
5.0000 mg | ORAL_TABLET | ORAL | Status: DC | PRN
  Administered 2022-01-09 – 2022-01-11 (×3): 5 mg via ORAL
  Filled 2022-01-09 (×2): qty 1

## 2022-01-09 MED ORDER — ACETAMINOPHEN 500 MG PO TABS
1000.0000 mg | ORAL_TABLET | Freq: Four times a day (QID) | ORAL | Status: AC
  Administered 2022-01-09 – 2022-01-10 (×4): 1000 mg via ORAL
  Filled 2022-01-09 (×4): qty 2

## 2022-01-09 MED ORDER — ROCURONIUM BROMIDE 10 MG/ML (PF) SYRINGE
PREFILLED_SYRINGE | INTRAVENOUS | Status: AC
  Filled 2022-01-09: qty 40

## 2022-01-09 MED ORDER — FENTANYL CITRATE (PF) 100 MCG/2ML IJ SOLN
25.0000 ug | INTRAMUSCULAR | Status: DC | PRN
  Administered 2022-01-09 (×4): 25 ug via INTRAVENOUS

## 2022-01-09 MED ORDER — SUGAMMADEX SODIUM 200 MG/2ML IV SOLN
INTRAVENOUS | Status: DC | PRN
  Administered 2022-01-09: 300 mg via INTRAVENOUS

## 2022-01-09 MED ORDER — ACETAMINOPHEN 650 MG RE SUPP: 650.0000 mg | RECTAL | Status: DC | PRN

## 2022-01-09 MED ORDER — FENTANYL CITRATE (PF) 100 MCG/2ML IJ SOLN
INTRAMUSCULAR | Status: AC
  Filled 2022-01-09: qty 2

## 2022-01-09 MED ORDER — LIDOCAINE 2% (20 MG/ML) 5 ML SYRINGE
INTRAMUSCULAR | Status: DC | PRN
  Administered 2022-01-09: 100 mg via INTRAVENOUS

## 2022-01-09 MED ORDER — SODIUM CHLORIDE 0.9% FLUSH: 3.0000 mL | INTRAVENOUS | Status: DC | PRN

## 2022-01-09 MED ORDER — PHENOL 1.4 % MT LIQD: 1.0000 | OROMUCOSAL | Status: DC | PRN

## 2022-01-09 MED ORDER — ROCURONIUM BROMIDE 10 MG/ML (PF) SYRINGE
PREFILLED_SYRINGE | INTRAVENOUS | Status: DC | PRN
  Administered 2022-01-09 (×6): 50 mg via INTRAVENOUS

## 2022-01-09 MED ORDER — ALBUTEROL SULFATE HFA 108 (90 BASE) MCG/ACT IN AERS
INHALATION_SPRAY | RESPIRATORY_TRACT | Status: DC | PRN
  Administered 2022-01-09: 4 via RESPIRATORY_TRACT

## 2022-01-09 SURGICAL SUPPLY — 69 items
APL SKNCLS STERI-STRIP NONHPOA (GAUZE/BANDAGES/DRESSINGS) ×1
BAG COUNTER SPONGE SURGICOUNT (BAG) ×1 IMPLANT
BAG SPNG CNTER NS LX DISP (BAG) ×1
BASKET BONE COLLECTION (BASKET) ×1 IMPLANT
BENZOIN TINCTURE PRP APPL 2/3 (GAUZE/BANDAGES/DRESSINGS) ×1 IMPLANT
BLADE BONE MILL MEDIUM (MISCELLANEOUS) IMPLANT
BLADE CLIPPER SURG (BLADE) IMPLANT
BUR MATCHSTICK NEURO 3.0 LAGG (BURR) ×1 IMPLANT
BUR PRECISION FLUTE 6.0 (BURR) ×1 IMPLANT
CANISTER SUCT 3000ML PPV (MISCELLANEOUS) ×1 IMPLANT
CAP LOCK DLX THRD (Cap) IMPLANT
CNTNR URN SCR LID CUP LEK RST (MISCELLANEOUS) ×1 IMPLANT
CONNECTOR CROSS 6.0-6.35X48-60 (Connector) IMPLANT
CONT SPEC 4OZ STRL OR WHT (MISCELLANEOUS) ×1
COVER BACK TABLE 60X90IN (DRAPES) ×1 IMPLANT
DRAPE C-ARM 42X72 X-RAY (DRAPES) ×2 IMPLANT
DRAPE HALF SHEET 40X57 (DRAPES) ×1 IMPLANT
DRAPE LAPAROTOMY 100X72X124 (DRAPES) ×1 IMPLANT
DRAPE SURG 17X23 STRL (DRAPES) ×4 IMPLANT
DRIVER SHAFT ALTERA INSERT (ORTHOPEDIC DISPOSABLE SUPPLIES) IMPLANT
DRSG OPSITE POSTOP 4X6 (GAUZE/BANDAGES/DRESSINGS) ×1 IMPLANT
DRSG OPSITE POSTOP 4X8 (GAUZE/BANDAGES/DRESSINGS) IMPLANT
ELECT BLADE 4.0 EZ CLEAN MEGAD (MISCELLANEOUS) ×1
ELECT REM PT RETURN 9FT ADLT (ELECTROSURGICAL) ×1
ELECTRODE BLDE 4.0 EZ CLN MEGD (MISCELLANEOUS) ×1 IMPLANT
ELECTRODE REM PT RTRN 9FT ADLT (ELECTROSURGICAL) ×1 IMPLANT
GAUZE 4X4 16PLY ~~LOC~~+RFID DBL (SPONGE) ×1 IMPLANT
GLOVE BIO SURGEON STRL SZ 6 (GLOVE) ×1 IMPLANT
GLOVE BIO SURGEON STRL SZ8 (GLOVE) ×2 IMPLANT
GLOVE BIO SURGEON STRL SZ8.5 (GLOVE) ×2 IMPLANT
GLOVE BIOGEL PI IND STRL 6.5 (GLOVE) ×1 IMPLANT
GLOVE EXAM NITRILE XL STR (GLOVE) IMPLANT
GOWN STRL REUS W/ TWL LRG LVL3 (GOWN DISPOSABLE) ×1 IMPLANT
GOWN STRL REUS W/ TWL XL LVL3 (GOWN DISPOSABLE) ×2 IMPLANT
GOWN STRL REUS W/TWL 2XL LVL3 (GOWN DISPOSABLE) IMPLANT
GOWN STRL REUS W/TWL LRG LVL3 (GOWN DISPOSABLE) ×1
GOWN STRL REUS W/TWL XL LVL3 (GOWN DISPOSABLE) ×2
HEMOSTAT POWDER KIT SURGIFOAM (HEMOSTASIS) ×1 IMPLANT
KIT BASIN OR (CUSTOM PROCEDURE TRAY) ×1 IMPLANT
KIT GRAFTMAG DEL NEURO DISP (NEUROSURGERY SUPPLIES) IMPLANT
KIT TURNOVER KIT B (KITS) ×1 IMPLANT
MILL BONE PREP (MISCELLANEOUS) IMPLANT
NDL HYPO 21X1.5 SAFETY (NEEDLE) IMPLANT
NEEDLE HYPO 21X1.5 SAFETY (NEEDLE) IMPLANT
NEEDLE HYPO 22GX1.5 SAFETY (NEEDLE) ×1 IMPLANT
NS IRRIG 1000ML POUR BTL (IV SOLUTION) ×1 IMPLANT
PACK LAMINECTOMY NEURO (CUSTOM PROCEDURE TRAY) ×1 IMPLANT
PAD ARMBOARD 7.5X6 YLW CONV (MISCELLANEOUS) ×3 IMPLANT
PATTIES SURGICAL .5 X1 (DISPOSABLE) IMPLANT
PATTIES SURGICAL 1X1 (DISPOSABLE) IMPLANT
PUTTY DBM 10CC CALC GRAN (Putty) IMPLANT
ROD CURVED TI 6.35X65 (Rod) IMPLANT
SCREW PA DLX CREO 7.5X50 (Screw) IMPLANT
SCREW PA DLX CREO 7.5X55 (Screw) IMPLANT
SPACER ALTERA 10X31 10-14-8 (Spacer) IMPLANT
SPIKE FLUID TRANSFER (MISCELLANEOUS) ×1 IMPLANT
SPONGE NEURO XRAY DETECT 1X3 (DISPOSABLE) IMPLANT
SPONGE SURGIFOAM ABS GEL 100 (HEMOSTASIS) IMPLANT
SPONGE T-LAP 4X18 ~~LOC~~+RFID (SPONGE) IMPLANT
STRIP CLOSURE SKIN 1/2X4 (GAUZE/BANDAGES/DRESSINGS) ×1 IMPLANT
SUT VIC AB 1 CT1 18XBRD ANBCTR (SUTURE) ×2 IMPLANT
SUT VIC AB 1 CT1 8-18 (SUTURE) ×2
SUT VIC AB 2-0 CP2 18 (SUTURE) ×2 IMPLANT
SYR 20ML LL LF (SYRINGE) IMPLANT
TAP SURG GLBU 6.5X40 (ORTHOPEDIC DISPOSABLE SUPPLIES) IMPLANT
TOWEL GREEN STERILE (TOWEL DISPOSABLE) ×1 IMPLANT
TOWEL GREEN STERILE FF (TOWEL DISPOSABLE) ×1 IMPLANT
TRAY FOLEY MTR SLVR 16FR STAT (SET/KITS/TRAYS/PACK) ×1 IMPLANT
WATER STERILE IRR 1000ML POUR (IV SOLUTION) ×1 IMPLANT

## 2022-01-09 NOTE — Transfer of Care (Signed)
Immediate Anesthesia Transfer of Care Note  Patient: Kenneth [name]  Procedure(s) Performed: Posterior Lumbar Interbody Fusion,Lumbar Four- Lumbar Five, Lumbar Five-Sacral One (Spine Lumbar)  Patient Location: PACU  Anesthesia Type:General  Level of Consciousness: Drowsy  Airway & Oxygen Therapy: Patient Spontanous Breathing and Patient connected to face mask oxygen  Post-op Assessment: Report given to RN and Post -op Vital signs reviewed and stable  Post vital signs: Reviewed and stable  Last Vitals:  Vitals Value Taken Time  BP 140/77 01/09/22 1500  Temp    Pulse 127 01/09/22 1502  Resp 13 01/09/22 1502  SpO2 89 % 01/09/22 1502  Vitals shown include unvalidated device data.  Last Pain:  Vitals:   01/09/22 0621  TempSrc: Oral  PainSc:          Complications: No notable events documented.

## 2022-01-09 NOTE — Anesthesia Procedure Notes (Signed)
Procedure Name: Intubation Date/Time: 01/09/2022 7:54 AM  Performed by: Griffin Dakin, CRNAPre-anesthesia Checklist: Patient identified, Emergency Drugs available, Suction available and Patient being monitored Patient Re-evaluated:Patient Re-evaluated prior to induction Oxygen Delivery Method: Circle system utilized Preoxygenation: Pre-oxygenation with 100% oxygen Induction Type: IV induction and Rapid sequence Laryngoscope Size: Mac and 4 Grade View: Grade II Tube type: Oral Tube size: 7.5 mm Number of attempts: 1 Airway Equipment and Method: Stylet Placement Confirmation: ETT inserted through vocal cords under direct vision, positive ETCO2 and breath sounds checked- equal and bilateral Secured at: 23 cm Tube secured with: Tape Dental Injury: Teeth and Oropharynx as per pre-operative assessment

## 2022-01-09 NOTE — H&P (Signed)
Subjective: The patient is a 46 year old white male who is complaining of back and right greater left leg pain consistent with neurogenic claudication.  He has failed medical management.  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  I discussed the various treatment options with him.  He has decided proceed with surgery.  Past Medical History:  Diagnosis Date   Arthritis    BACK   Asthma due to environmental allergies    At risk for sleep apnea    STOP-BANG= 4       SENT TO PCP 04-05-2014   Carpal tunnel syndrome, right    GERD (gastroesophageal reflux disease)    Sleep apnea 2015   CPAP Nightly    Past Surgical History:  Procedure Laterality Date   CARPAL TUNNEL RELEASE Right 04/06/2014   Procedure: RIGHT CARPAL TUNNEL RELEASE;  Surgeon: Bradly Bienenstock, MD;  Location: [facility];  Service: Orthopedics;  Laterality: Right;   CARPAL TUNNEL RELEASE Left 2019   FOOT SURGERY     HERNIA REPAIR  2019   Umbilical   LASIK  2001   SHOULDER ARTHROSCOPY WITH ROTATOR CUFF REPAIR Left 01/20/2005   WISDOM TOOTH EXTRACTION  01/21/2003    Allergies  Allergen Reactions   Hydrocodone-Acetaminophen Itching    Social History   Tobacco Use   Smoking status: Never   Smokeless tobacco: Current    Types: Snuff  Substance Use Topics   Alcohol use: Yes    Comment: occasional    History reviewed. No pertinent family history. Prior to Admission medications   Medication Sig Start Date End Date Taking? Authorizing Provider  albuterol (VENTOLIN HFA) 108 (90 Base) MCG/ACT inhaler Inhale 2 puffs into the lungs every 4 (four) hours as needed for wheezing or shortness of breath.   Yes [provider]  cetirizine (ZYRTEC) 10 MG tablet Take 10 mg by mouth daily as needed for allergies.    Yes [provider]  fluticasone (FLONASE) 50 MCG/ACT nasal spray Place 1 spray into both nostrils in the  morning and at bedtime.   Yes [provider]  pantoprazole (PROTONIX) 40 MG tablet Take 40 mg by mouth daily. 11/30/19  Yes [provider]  rosuvastatin (CRESTOR) 20 MG tablet Take 20 mg by mouth at bedtime. 11/21/19  Yes [provider]  tamsulosin (FLOMAX) 0.4 MG CAPS capsule Take 0.4 mg by mouth daily.   Yes [provider]     Review of Systems  Positive ROS: As above  All other systems have been reviewed and were otherwise negative with the exception of those mentioned in the HPI and as above.  Objective: Vital signs in last 24 hours: Temp:  [98.3 F (36.8 C)] 98.3 F (36.8 C) (12/21 0621) Pulse Rate:  [67] 67 (12/21 0621) Resp:  [18] 18 (12/21 0621) BP: (118)/(74) 118/74 (12/21 0621) SpO2:  [97 %] 97 % (12/21 0621) Weight:  [149.7 kg] 149.7 kg (12/21 0627) Estimated body mass index is 43.54 kg/m as calculated from the following:   Height as of this encounter: 6\' 1"  (1.854 m).   Weight as of this encounter: 149.7 kg.   General Appearance: Alert Head: Normocephalic, without obvious abnormality, atraumatic Eyes: PERRL, conjunctiva/corneas clear, EOM's intact,    Ears: Normal  Throat: Normal  Neck: Supple, Back: unremarkable Lungs: Clear to auscultation bilaterally, respirations unlabored Heart: Regular rate and rhythm, no murmur, rub or gallop Abdomen: Soft, non-tender  Extremities: Extremities normal, atraumatic, no cyanosis or edema Skin: unremarkable  NEUROLOGIC:   Mental status: alert and oriented,Motor Exam - grossly normal Sensory Exam - grossly normal Reflexes:  Coordination - grossly normal Gait - grossly normal Balance - grossly normal Cranial Nerves: I: smell Not tested  II: visual acuity  OS: Normal  OD: Normal   II: visual fields Full to confrontation  II: pupils Equal, round, reactive to light  III,VII: ptosis None  III,IV,VI: extraocular muscles  Full ROM  V: mastication Normal  V: facial light touch  sensation  Normal  V,VII: corneal reflex  Present  VII: facial muscle function - upper  Normal  VII: facial muscle function - lower Normal  VIII: hearing Not tested  IX: soft palate elevation  Normal  IX,X: gag reflex Present  XI: trapezius strength  5/5  XI: sternocleidomastoid strength 5/5  XI: neck flexion strength  5/5  XII: tongue strength  Normal    Data Review Lab Results  Component Value Date   WBC 8.6 01/01/2022   HGB 15.6 01/01/2022   HCT 43.4 01/01/2022   MCV 88.9 01/01/2022   PLT 186 01/01/2022   Lab Results  Component Value Date   NA 132 (L) 12/14/2019   K 5.0 12/14/2019   CL 95 (L) 12/14/2019   CO2 27 12/14/2019   BUN 22 (H) 12/14/2019   CREATININE 0.78 12/16/2019   GLUCOSE 195 (H) 12/14/2019   No results found for: "INR", "PROTIME"  Assessment/Plan: L4-5 and L5-S1 spondylolisthesis, spinal stenosis, lumbago, lumbar radiculopathy, neurogenic claudication: I have discussed the situation with the patient.  I reviewed his imaging studies with him and pointed out the abnormalities.  We have discussed the various treatment options including surgery.  I described the surgical treatment option of an L4-5 and L5-S1 disc instrumentation and fusion.  I have shown him surgical models.  We have given him a surgical pamphlet.  We have discussed the risk, benefits, alternatives, expected postoperative course, and likelihood of achieving appropriate surgery.  I answered all the patient's questions.  He has decided to proceed with surgery.   Cristi Loron 01/09/2022 7:23 AM

## 2022-01-09 NOTE — Anesthesia Postprocedure Evaluation (Signed)
Anesthesia Post Note  Patient: Kenneth [name]  Procedure(s) Performed: Posterior Lumbar Interbody Fusion,Lumbar Four- Lumbar Five, Lumbar Five-Sacral One (Spine Lumbar)     Patient location during evaluation: PACU Anesthesia Type: General Level of consciousness: awake Pain management: pain level controlled Vital Signs Assessment: post-procedure vital signs reviewed and stable Respiratory status: spontaneous breathing Cardiovascular status: stable Postop Assessment: no apparent nausea or vomiting Anesthetic complications: no   No notable events documented.  Last Vitals:  Vitals:   01/09/22 1500 01/09/22 1515  BP: (!) 140/77 119/76  Pulse: (!) 111 (!) 114  Resp: 16 14  Temp: 36.5 C   SpO2: 97% 100%    Last Pain:  Vitals:   01/09/22 0621  TempSrc: Oral  PainSc:                  [name]

## 2022-01-09 NOTE — Progress Notes (Signed)
Orthopedic Tech Progress Note Patient Details:  Kenneth [name] 04-21-1975 [id number]  LSO adjusted and at bedside for when OOB.   Ortho Devices Type of Ortho Device: Lumbar corsett Ortho Device/Splint Location: adjusted and at bedside Ortho Device/Splint Interventions: Ordered, Adjustment   Post Interventions Patient Tolerated: Well Instructions Provided: Care of device, Adjustment of device  Kenneth [name] 01/09/2022, 7:17 PM

## 2022-01-09 NOTE — Anesthesia Preprocedure Evaluation (Signed)
Anesthesia Evaluation  Patient identified by MRN, date of birth, ID band Patient awake    Reviewed: Allergy & Precautions, NPO status , Patient's Chart, lab work & pertinent test results  Airway Mallampati: II       Dental   Pulmonary asthma , sleep apnea , pneumonia   breath sounds clear to auscultation       Cardiovascular negative cardio ROS  Rhythm:Regular Rate:Normal     Neuro/Psych    GI/Hepatic Neg liver ROS,GERD  ,,  Endo/Other  negative endocrine ROS    Renal/GU negative Renal ROS     Musculoskeletal  (+) Arthritis ,    Abdominal   Peds  Hematology   Anesthesia Other Findings   Reproductive/Obstetrics                             Anesthesia Physical Anesthesia Plan  ASA: 3  Anesthesia Plan: General   Post-op Pain Management:    Induction: Intravenous  PONV Risk Score and Plan: Ondansetron, Dexamethasone and Midazolam  Airway Management Planned: Oral ETT  Additional Equipment:   Intra-op Plan:   Post-operative Plan: Possible Post-op intubation/ventilation  Informed Consent: I have reviewed the patients History and Physical, chart, labs and discussed the procedure including the risks, benefits and alternatives for the proposed anesthesia with the patient or authorized representative who has indicated his/her understanding and acceptance.     Dental advisory given  Plan Discussed with: CRNA and Anesthesiologist  Anesthesia Plan Comments:        Anesthesia Quick Evaluation

## 2022-01-09 NOTE — Progress Notes (Signed)
Report received from PACU RN.  Spoke with Leotis Shames, ortho tech regarding Aspen lumbar brace. Per tech, brace  will be delivered and fitted to pt after transfer to unit.

## 2022-01-09 NOTE — Op Note (Signed)
Brief history: The patient is a 46 year old white male who has complained of chronic back and leg pain.  He has failed medical management.  He was worked up with a lumbar MRI, lumbar myelo CT and lumbar x-rays.  The studies demonstrated an L4-5 and L5-S1 spondylolisthesis, facet arthropathy, etc.  I discussed the various treatment options with him.  He has decided to proceed with surgery.  Preoperative diagnosis: L4-5 and L5-S1 spondylolisthesis, spondylolysis,  spinal stenosis  lumbago; lumbar radiculopathy; neurogenic claudication  Postoperative diagnosis: The same  Procedure: L4-5 and L5-S1 Gill procedure/laminectomy/foraminotomies/medial facetectomy to decompress the bilateral L4, L5 and S1 nerve roots(the work required to do this was in addition to the work required to do the posterior lumbar interbody fusion because of the patient's spinal stenosis, facet arthropathy. Etc. requiring a wide decompression of the nerve roots.);  Right L4-5 and L5-S1 transforaminal lumbar interbody fusion with local morselized autograft bone and Zimmer DBM; insertion of interbody prosthesis at L4-5 and L5-S1 (globus peek expandable interbody prosthesis); posterior segmental instrumentation from L4 to S1 with globus titanium pedicle screws and rods; posterior lateral arthrodesis at L4-5 and L5-S1 with local morselized autograft bone and Zimmer DBM.  Surgeon: Dr. Delma Officer  Asst.: Hildred Priest, NP  Anesthesia: Gen. endotracheal  Estimated blood loss: 350 cc  Drains: None  Complications: None  Description of procedure: The patient was brought to the operating room by the anesthesia team. General endotracheal anesthesia was induced. The patient was turned to the prone position on the Wilson frame. The patient's lumbosacral region was then prepared with Betadine scrub and Betadine solution. Sterile drapes were applied.  I then injected the area to be incised with Marcaine with epinephrine solution. I then used  the scalpel to make a linear midline incision over the L4-5 and L5-S1 interspace. I then used electrocautery to perform a bilateral subperiosteal dissection exposing the spinous process and lamina of L4, L5 and S1. We then obtained intraoperative radiograph to confirm our location. We then inserted the Verstrac retractor to provide exposure.  I began the decompression by using the high speed drill to perform laminotomies at L4-5 and L5-S1 bilaterally.  The patient had bilateral spondylolysis at L4-5 and L5-S1 bilaterally with a L4 and L5 free-floating lamina at the L4.  We removed the patient's L4 and L5 lamina and facets using a Leksell rongeur i.e. performed L4-5 and L5-S1 Gill procedure.   We performed wide foraminotomies about the bilateral L4, L5 and S1 nerve roots completing the decompression.  We now turned our attention to the posterior lumbar interbody fusion. I used a scalpel to incise the intervertebral disc at L4-5 and L5-S1 bilaterally. I then performed a partial intervertebral discectomy at L4-5 and L5-S1 bilaterally using the pituitary forceps. We prepared the vertebral endplates at L4-5 and L5-S1 bilaterally for the fusion by removing the soft tissues with the curettes. We then used the trial spacers to pick the appropriate sized interbody prosthesis. We prefilled his prosthesis with a combination of local morselized autograft bone that we obtained during the decompression as well as Zimmer DBM. We inserted the prefilled prosthesis into the interspace at L4-5 and L5-S1 from the right, we then turned and expanded the prosthesis. There was a good snug fit of the prosthesis in the interspace. We then filled and the remainder of the intervertebral disc space with local morselized autograft bone and Zimmer DBM. This completed the posterior lumbar interbody arthrodesis.  During the decompression and insertion of the prosthesis the assistant  protected the thecal sac and nerve roots with the D'Errico  retractor.  We now turned attention to the instrumentation. Under fluoroscopic guidance we cannulated the bilateral L4, L5 and S1 pedicles with the bone probe. We then removed the bone probe. We then tapped the pedicle with a 6.5 millimeter tap. We then removed the tap. We probed inside the tapped pedicle with a ball probe to rule out cortical breaches. We then inserted a 7.5 x 50 and 55 millimeter pedicle screw into the L4, L5 and S1 pedicles bilaterally under fluoroscopic guidance. We then palpated along the medial aspect of the pedicles to rule out cortical breaches. There were none. The nerve roots were not injured. We then connected the unilateral pedicle screws with a lordotic rod. We compressed the construct and secured the rod in place with the caps. We then tightened the caps appropriately.  We placed a cross connector between the rods.  This completed the instrumentation from L4-S1 bilaterally.  We now turned our attention to the posterior lateral arthrodesis at L4-5 and L5-S1 bilaterally. We used the high-speed drill to decorticate the remainder of the facets and transverse process at L4-5 and L5-S1 bilaterally. We then applied a combination of local morselized autograft bone and Zimmer DBM over these decorticated posterior lateral structures. This completed the posterior lateral arthrodesis.  We then obtained hemostasis using bipolar electrocautery. We irrigated the wound out with bacitracin solution. We inspected the thecal sac and nerve roots and noted they were well decompressed. We then removed the retractor.  We injected Exparel . We reapproximated patient's thoracolumbar fascia with interrupted #1 Vicryl suture. We reapproximated patient's subcutaneous tissue with interrupted 2-0 Vicryl suture. The reapproximated patient's skin with Steri-Strips and benzoin. The wound was then coated with bacitracin ointment. A sterile dressing was applied. The drapes were removed. The patient was  subsequently returned to the supine position where they were extubated by the anesthesia team. He was then transported to the post anesthesia care unit in stable condition. All sponge instrument and needle counts were reportedly correct at the end of this case.

## 2022-01-10 DIAGNOSIS — M4317 Spondylolisthesis, lumbosacral region: Secondary | ICD-10-CM | POA: Diagnosis not present

## 2022-01-10 MED FILL — Thrombin For Soln 5000 Unit: CUTANEOUS | Qty: 5000 | Status: AC

## 2022-01-10 NOTE — Evaluation (Signed)
Physical Therapy Evaluation  Patient Details Name: Kenneth [name] MRN: [id number] DOB: 1975-06-23 Today's Date: 01/10/2022  History of Present Illness  Pt is a 46 y/o male S/P L4-5 L5-S1 disc instrumentation and fusion completed on 01/09/22.  Clinical Impression  Pt admitted with above diagnosis. At the time of PT eval, pt initially required +2 assist to power up to stand from low recliner chair. Pt reports he slept in the chair last night because he was unable to stand to get back to bed. Stedy utilized initially to get out of the low recliner chair and pt was transitioned to the RW. From an elevated surface pt managed transfers fairly well. Pt reports his bed at home is high. Pt was educated on precautions, brace application/wearing schedule, appropriate activity progression, and car transfer. Pt currently with functional limitations due to the deficits listed below (see PT Problem List). Pt will benefit from skilled PT to increase their independence and safety with mobility to allow discharge to the venue listed below.         Recommendations for follow up therapy are one component of a multi-disciplinary discharge planning process, led by the attending physician.  Recommendations may be updated based on patient status, additional functional criteria and insurance authorization.  Follow Up Recommendations No PT follow up      Assistance Recommended at Discharge PRN  Patient can return home with the following  A little help with walking and/or transfers;A little help with bathing/dressing/bathroom;Assistance with cooking/housework;Assist for transportation;Help with stairs or ramp for entrance    Equipment Recommendations None recommended by PT  Recommendations for Other Services       Functional Status Assessment Patient has had a recent decline in their functional status and demonstrates the ability to make significant improvements in function in a reasonable and predictable  amount of time.     Precautions / Restrictions Precautions Precautions: Back Precaution Booklet Issued: Yes (comment) Precaution Comments: provided handout Required Braces or Orthoses: Spinal Brace Spinal Brace: Lumbar corset;Applied in sitting position Restrictions Weight Bearing Restrictions: No      Mobility  Bed Mobility Overal bed mobility: Needs Assistance Bed Mobility: Sit to Sidelying, Rolling Rolling: Min assist       Sit to sidelying: Min assist General bed mobility comments: Required VC to maintain back precautions and for log roll technique. Bed height elevated, HOB flat and rails lowered to simulate home environment.    Transfers Overall transfer level: Needs assistance Equipment used: 2 person hand held assist, Ambulation equipment used Transfers: Sit to/from Stand, Bed to chair/wheelchair/BSC Sit to Stand: Max assist, +2 physical assistance, +2 safety/equipment           General transfer comment: Pt experienced increased pain at lowest squat position when transitioning to sit<>stand. Antony Salmon was used along with two person assist to complete sit to stand from recliner. Less physical assist required to perform sit<>stand to RW from elevated surface when seated EOB. Transfer via Lift Equipment: Stedy  Ambulation/Gait Ambulation/Gait assistance: Land (Feet): 200 Feet Assistive device: Rolling walker (2 wheels) Gait Pattern/deviations: Step-through pattern, Decreased stride length, Trunk flexed Gait velocity: Decreased Gait velocity interpretation: 1.31 - 2.62 ft/sec, indicative of limited community ambulator   General Gait Details: VC's for improved posture, closer walker proximity, and forward gaze. No assist required but hands on guarding provided for safety.  Stairs            Wheelchair Mobility    Modified Rankin (Stroke Patients Only)  Balance Overall balance assessment: Needs assistance Sitting-balance support:  Feet supported, No upper extremity supported Sitting balance-Leahy Scale: Fair (due to pain level) Sitting balance - Comments: seated in recliner and/or EOB   Standing balance support: Bilateral upper extremity supported, Reliant on assistive device for balance Standing balance-Leahy Scale: Poor Standing balance comment: utilized stedy and/or RW to maintain standing balance.                             Pertinent Vitals/Pain Pain Assessment Pain Assessment: Faces Faces Pain Scale: Hurts whole lot Pain Location: back during mobility and movement Pain Descriptors / Indicators: Grimacing, Discomfort, Guarding, Operative site guarding Pain Intervention(s): Limited activity within patient's tolerance, Monitored during session, Repositioned    Home Living Family/patient expects to be discharged to:: Private residence Living Arrangements: Spouse/significant other Available Help at Discharge: Family;Available 24 hours/day Type of Home: House Home Access: Stairs to enter Entrance Stairs-Rails: Left Entrance Stairs-Number of Steps: 3   Home Layout: Two level;Able to live on main level with bedroom/bathroom Home Equipment: Adaptive equipment;Toilet riser;Cane - Programmer, applications (2 wheels);Wheelchair - manual      Prior Function Prior Level of Function : Independent/Modified Independent                     Hand Dominance   Dominant Hand: Right    Extremity/Trunk Assessment   Upper Extremity Assessment Upper Extremity Assessment: Defer to OT evaluation    Lower Extremity Assessment Lower Extremity Assessment: Generalized weakness (Mild; consistent with pre-op diagnosis)    Cervical / Trunk Assessment Cervical / Trunk Assessment: Back Surgery  Communication   Communication: No difficulties  Cognition Arousal/Alertness: Awake/alert Behavior During Therapy: WFL for tasks assessed/performed Overall Cognitive Status: Within Functional Limits for tasks  assessed                                          General Comments General comments (skin integrity, edema, etc.): VSS    Exercises     Assessment/Plan    PT Assessment Patient needs continued PT services  PT Problem List Decreased strength;Decreased range of motion;Decreased activity tolerance;Decreased balance;Decreased mobility;Decreased knowledge of use of DME;Decreased safety awareness;Decreased knowledge of precautions;Pain       PT Treatment Interventions DME instruction;Gait training;Stair training;Therapeutic activities;Functional mobility training;Therapeutic exercise;Balance training;Patient/family education    PT Goals (Current goals can be found in the Care Plan section)  Acute Rehab PT Goals Patient Stated Goal: Home tomorrow PT Goal Formulation: With patient/family Time For Goal Achievement: 01/17/22 Potential to Achieve Goals: Good    Frequency Min 5X/week     Co-evaluation PT/OT/SLP Co-Evaluation/Treatment: Yes Reason for Co-Treatment: Complexity of the patient's impairments (multi-system involvement);For patient/therapist safety;To address functional/ADL transfers PT goals addressed during session: Mobility/safety with mobility;Balance;Proper use of DME;Strengthening/ROM OT goals addressed during session: ADL's and self-care;Proper use of Adaptive equipment and DME;Strengthening/ROM       AM-PAC PT "6 Clicks" Mobility  Outcome Measure Help needed turning from your back to your side while in a flat bed without using bedrails?: A Little Help needed moving from lying on your back to sitting on the side of a flat bed without using bedrails?: A Little Help needed moving to and from a bed to a chair (including a wheelchair)?: A Lot Help needed standing up from a chair using your arms (e.g., wheelchair  or bedside chair)?: A Lot Help needed to walk in hospital room?: A Little Help needed climbing 3-5 steps with a railing? : A Little 6 Click  Score: 16    End of Session Equipment Utilized During Treatment: Gait belt;Back brace Activity Tolerance: Patient tolerated treatment well Patient left: in bed;with call bell/phone within reach;with family/visitor present Nurse Communication: Mobility status PT Visit Diagnosis: Unsteadiness on feet (R26.81);Pain Pain - part of body:  (back)    Time: 8341-9622 PT Time Calculation (min) (ACUTE ONLY): 46 min   Charges:   PT Evaluation $PT Eval Low Complexity: 1 Low PT Treatments $Gait Training: 8-22 mins        Conni Slipper, PT, DPT Acute Rehabilitation Services Secure Chat Preferred Office: [phone]   Marylynn Pearson 01/10/2022, 1:47 PM

## 2022-01-10 NOTE — Progress Notes (Signed)
Subjective: The patient is alert and pleasant.  His back is appropriately sore.  Objective: Vital signs in last 24 hours: Temp:  [97.4 F (36.3 C)-98.9 F (37.2 C)] 97.7 F (36.5 C) (12/22 0734) Pulse Rate:  [85-114] 85 (12/22 0734) Resp:  [13-20] 16 (12/22 0734) BP: (101-145)/(66-94) 116/79 (12/22 0734) SpO2:  [89 %-100 %] 89 % (12/22 0734) Estimated body mass index is 43.54 kg/m as calculated from the following:   Height as of this encounter: 6\' 1"  (1.854 m).   Weight as of this encounter: 149.7 kg.   Intake/Output from previous day: 12/21 0701 - 12/22 0700 In: 2150 [I.V.:1250; Blood:200; IV Piggyback:700] Out: 3075 [Urine:2575; Blood:500] Intake/Output this shift: No intake/output data recorded.  Physical exam the patient is alert and pleasant.  His strength is grossly normal.  His dressing is clean and dry.  Lab Results: No results for input(s): "WBC", "HGB", "HCT", "PLT" in the last 72 hours. BMET No results for input(s): "NA", "K", "CL", "CO2", "GLUCOSE", "BUN", "CREATININE", "CALCIUM" in the last 72 hours.  Studies/Results: DG Lumbar Spine 2-3 Views  Result Date: 01/09/2022 CLINICAL DATA:  Surgery, elective EXAM: LUMBAR SPINE - 2-3 VIEW COMPARISON:  Same day radiograph, CT 07/15/2021 FINDINGS: Intraoperative images during L4-S1 lumbosacral fusion. Pedicle screws and intervertebral disc spaces are in place. Posterior fusion rods not yet in place. Tissue retractors noted. IMPRESSION: Intraoperative images during L4-S1 lumbosacral fusion. Electronically Signed   By: 07/17/2021 M.D.   On: 01/09/2022 15:57   DG C-Arm 1-60 Min-No Report  Result Date: 01/09/2022 Fluoroscopy was utilized by the requesting physician.  No radiographic interpretation.   DG C-Arm 1-60 Min-No Report  Result Date: 01/09/2022 Fluoroscopy was utilized by the requesting physician.  No radiographic interpretation.   DG Lumbar Spine 1 View  Result Date: 01/09/2022 CLINICAL DATA:  Surgery,  like deve EXAM: LUMBAR SPINE - 1 VIEW COMPARISON:  CT lumbar spine 07/15/2021 FINDINGS: Intraoperative lateral radiograph for localization. There is an instrument probe located at the level of L4-L5. IMPRESSION: Instrument localization at L4-L5. Electronically Signed   By: 07/17/2021 M.D.   On: 01/09/2022 11:13    Assessment/Plan: Postoperative day #1: The patient has not urinated yet.  We will mobilize him with PT and OT.  He may go home later on today.  I gave him his discharge instructions and answered all his questions.  LOS: 0 days     01/11/2022 01/10/2022, 7:46 AM     Patient ID: 01/12/2022, male   DOB: 07-Jun-1975, 46 y.o.   MRN: [id number]

## 2022-01-10 NOTE — Evaluation (Signed)
Occupational Therapy Evaluation Patient Details Name: Kenneth [name] MRN: [id number] DOB: 11-15-1975 Today's Date: 01/10/2022   History of Present Illness Pt is a 46 y/o male S/P L4-5 L5-S1 disc instrumentation and fusion completed on 01/09/22.   Clinical Impression   Pt in recliner upon therapy arrival and agreeable to participate in OT evaluation. Wife present in room and supportive during evaluation. 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 Currently, pt is requiring increased physical assist to complete BADL tasks and 2 person mod-max a to complete sit to stands from a low surface level. When performing sit<>stand from an elevated surface, pt is able to complete with min guard using RW. Pt and wife educated on back precautions, log roll technique, sitting on higher surfaces, and amount of assistance that will be needed once home. Suggested pt stay one more night before returning home to further work on mentioned deficits before discharging home with wife assisting. Recommend HHOT. Acute OT will follow patient acutely.       Recommendations for follow up therapy are one component of a multi-disciplinary discharge planning process, led by the attending physician.  Recommendations may be updated based on patient status, additional functional criteria and insurance authorization.   Follow Up Recommendations  Home health OT     Assistance Recommended at Discharge PRN  Patient can return home with the following A lot of help with walking and/or transfers;A little help with bathing/dressing/bathroom;Assistance with cooking/housework;Help with stairs or ramp for entrance;Assist for transportation    Functional Status Assessment  Patient has had a recent decline in their functional status and demonstrates the ability to make significant improvements in function in a reasonable and predictable amount of time.  Equipment Recommendations  None  recommended by OT       Precautions / Restrictions Precautions Precautions: Back Precaution Booklet Issued: Yes (comment) Precaution Comments: provided handout Required Braces or Orthoses: Spinal Brace Spinal Brace: Lumbar corset;Applied in sitting position Restrictions Weight Bearing Restrictions: No      Mobility Bed Mobility Overal bed mobility: Needs Assistance Bed Mobility: Sit to Sidelying, Rolling Rolling: Min assist       Sit to sidelying: Min assist General bed mobility comments: Required VC to maintain back precautions and for log roll technique. Patient Response: Cooperative  Transfers Overall transfer level: Needs assistance Equipment used: 2 person hand held assist, Ambulation equipment used Transfers: Sit to/from Stand, Bed to chair/wheelchair/BSC Sit to Stand: Max assist, +2 physical assistance, +2 safety/equipment       General transfer comment: Pt experienced increased pain at lowest squat position when transitioning to sit<>stand. Antony Salmon was used along with two person assist to complete sit to stand from recliner. Less physical assist required to perform sit<>stand from elevated surface when seated EOB. Transfer via Lift Equipment: Stedy    Balance Overall balance assessment: Needs assistance Sitting-balance support: Feet supported, No upper extremity supported Sitting balance-Leahy Scale: Fair (due to pain level) Sitting balance - Comments: seated in recliner and/or EOB   Standing balance support: Bilateral upper extremity supported, Reliant on assistive device for balance Standing balance-Leahy Scale: Poor Standing balance comment: utilized stedy and/or RW to maintain standing balance.       ADL either performed or assessed with clinical judgement   ADL Overall ADL's : Needs assistance/impaired     Grooming: Wash/dry face;Wash/dry hands;Oral care;Applying deodorant;Minimal assistance;Sitting   Upper Body Bathing: Moderate assistance;Sitting    Lower Body Bathing: Total assistance;Cueing for back precautions;Sit  to/from stand Lower Body Bathing Details (indicate cue type and reason): utilizing stedy to stand Upper Body Dressing : Moderate assistance;Sitting   Lower Body Dressing: Cueing for back precautions;Sit to/from stand;Maximal assistance Lower Body Dressing Details (indicate cue type and reason): standing at EOB Toilet Transfer: Maximal assistance;+2 for physical assistance;+2 for safety/equipment;BSC/3in1 Toilet Transfer Details (indicate cue type and reason): using stedy. simulated Toileting- Clothing Manipulation and Hygiene: Total assistance;Cueing for back precautions;Sit to/from stand               Vision Baseline Vision/History: 1 Wears glasses Ability to See in Adequate Light: 0 Adequate Patient Visual Report: No change from baseline Vision Assessment?: No apparent visual deficits            Pertinent Vitals/Pain Pain Assessment Pain Assessment: Faces Faces Pain Scale: Hurts worst Pain Location: back during mobility and movement Pain Descriptors / Indicators: Grimacing, Discomfort, Guarding, Operative site guarding Pain Intervention(s): Limited activity within patient's tolerance, Monitored during session, Repositioned     Hand Dominance Right   Extremity/Trunk Assessment Upper Extremity Assessment Upper Extremity Assessment: Overall WFL for tasks assessed   Lower Extremity Assessment Lower Extremity Assessment: Defer to PT evaluation       Communication Communication Communication: No difficulties   Cognition Arousal/Alertness: Awake/alert Behavior During Therapy: WFL for tasks assessed/performed Overall Cognitive Status: Within Functional Limits for tasks assessed         General Comments  VSS            Home Living Family/patient expects to be discharged to:: Private residence Living Arrangements: Spouse/significant other Available Help at Discharge: Family;Available 24  hours/day Type of Home: House Home Access: Stairs to enter [employer] of Steps: 3 Entrance Stairs-Rails: Left Home Layout: Two level;Able to live on main level with bedroom/bathroom     Bathroom Shower/Tub: Chief Strategy Officer: Standard     Home Equipment: Adaptive equipment;Toilet riser;Cane - Programmer, applications (2 wheels);Wheelchair - Equities trader: Reacher        Prior Functioning/Environment Prior Level of Function : Independent/Modified Independent              OT Problem List: Decreased strength;Pain;Decreased activity tolerance;Impaired balance (sitting and/or standing);Decreased knowledge of precautions      OT Treatment/Interventions: Self-care/ADL training;Therapeutic exercise;Therapeutic activities;Energy conservation;Manual therapy;Modalities;Balance training;Patient/family education;DME and/or AE instruction    OT Goals(Current goals can be found in the care plan section) Acute Rehab OT Goals Patient Stated Goal: to be able to move around better in order to go home. OT Goal Formulation: With patient/family Time For Goal Achievement: 01/24/22 Potential to Achieve Goals: Good  OT Frequency: Min 2X/week    Co-evaluation PT/OT/SLP Co-Evaluation/Treatment: Yes Reason for Co-Treatment: To address functional/ADL transfers   OT goals addressed during session: ADL's and self-care;Proper use of Adaptive equipment and DME;Strengthening/ROM      AM-PAC OT "6 Clicks" Daily Activity     Outcome Measure Help from another person eating meals?: None Help from another person taking care of personal grooming?: None Help from another person toileting, which includes using toliet, bedpan, or urinal?: Total Help from another person bathing (including washing, rinsing, drying)?: A Lot Help from another person to put on and taking off regular upper body clothing?: A Little Help from another person to put on and taking off regular lower  body clothing?: A Lot 6 Click Score: 16   End of Session Equipment Utilized During Treatment: Gait belt;Rolling walker (2 wheels);Other (comment) Antony Salmon)  Activity Tolerance: Patient tolerated  treatment well;Patient limited by pain Patient left: in bed;with call bell/phone within reach;with family/visitor present  OT Visit Diagnosis: Muscle weakness (generalized) (M62.81);Unsteadiness on feet (R26.81)                Time: 5625-6389 OT Time Calculation (min): 40 min Charges:  OT General Charges $OT Visit: 1 Visit OT Evaluation $OT Eval High Complexity: 1 High  [employer], OTR/L,CBIS  Supplemental OT - [facility] and [facility]   Chabely Norby, Charisse March 01/10/2022, 1:22 PM

## 2022-01-11 DIAGNOSIS — M4317 Spondylolisthesis, lumbosacral region: Secondary | ICD-10-CM | POA: Diagnosis not present

## 2022-01-11 LAB — CBC
HCT: 35.6 % — ABNORMAL LOW (ref 39.0–52.0)
Hemoglobin: 12.4 g/dL — ABNORMAL LOW (ref 13.0–17.0)
MCH: 31.5 pg (ref 26.0–34.0)
MCHC: 34.8 g/dL (ref 30.0–36.0)
MCV: 90.4 fL (ref 80.0–100.0)
Platelets: 129 10*3/uL — ABNORMAL LOW (ref 150–400)
RBC: 3.94 MIL/uL — ABNORMAL LOW (ref 4.22–5.81)
RDW: 12.7 % (ref 11.5–15.5)
WBC: 11.9 10*3/uL — ABNORMAL HIGH (ref 4.0–10.5)
nRBC: 0 % (ref 0.0–0.2)

## 2022-01-11 LAB — BASIC METABOLIC PANEL
Anion gap: 9 (ref 5–15)
BUN: 10 mg/dL (ref 6–20)
CO2: 26 mmol/L (ref 22–32)
Calcium: 8.5 mg/dL — ABNORMAL LOW (ref 8.9–10.3)
Chloride: 99 mmol/L (ref 98–111)
Creatinine, Ser: 1.01 mg/dL (ref 0.61–1.24)
GFR, Estimated: >60 mL/min (ref >60)
Glucose, Bld: 119 mg/dL — ABNORMAL HIGH (ref 70–99)
Potassium: 3.6 mmol/L (ref 3.5–5.1)
Sodium: 134 mmol/L — ABNORMAL LOW (ref 135–145)

## 2022-01-11 MED ORDER — KETOROLAC TROMETHAMINE 30 MG/ML IJ SOLN
30.0000 mg | Freq: Four times a day (QID) | INTRAMUSCULAR | Status: DC
  Administered 2022-01-11 – 2022-01-12 (×5): 30 mg via INTRAVENOUS
  Filled 2022-01-11 (×5): qty 1

## 2022-01-11 NOTE — Progress Notes (Signed)
Patient with home CPAP at bedside and will self place when ready.

## 2022-01-11 NOTE — Progress Notes (Signed)
Occupational Therapy Treatment Patient Details Name: Kenneth [name] MRN: [id number] DOB: 1975/04/10 Today's Date: 01/11/2022   History of present illness Pt is a 46 y/o male S/P L4-5 L5-S1 disc instrumentation and fusion completed on 01/09/22.   OT comments  Patient seated on EOB with complaints of pain. Patient asking to go to bathroom. Patient and family educated on back brace and max assist to donn. Patient min assist to stand from raised bed and ambulated to bathroom and required assistance to use urinal while standing but patient progressed with being able to assist with clothing management. Patient assisted to supine with education on back precautions. Discharge recommendations for HHOT continue to be appropriate due to good family support to provided care. Acute OT to continue to follow.    Recommendations for follow up therapy are one component of a multi-disciplinary discharge planning process, led by the attending physician.  Recommendations may be updated based on patient status, additional functional criteria and insurance authorization.    Follow Up Recommendations  Home health OT     Assistance Recommended at Discharge PRN  Patient can return home with the following  A lot of help with walking and/or transfers;A little help with bathing/dressing/bathroom;Assistance with cooking/housework;Help with stairs or ramp for entrance;Assist for transportation   Equipment Recommendations  None recommended by OT    Recommendations for Other Services      Precautions / Restrictions Precautions Precautions: Back Precaution Booklet Issued: Yes (comment) Precaution Comments: provided handout Required Braces or Orthoses: Spinal Brace Spinal Brace: Lumbar corset;Applied in standing position Restrictions Weight Bearing Restrictions: No       Mobility Bed Mobility Overal bed mobility: Needs Assistance Bed Mobility: Rolling, Sit to Sidelying Rolling: Mod assist       Sit  to sidelying: Mod assist General bed mobility comments: requied assistance with LEs to return to bed and to completely roll over onto back    Transfers Overall transfer level: Needs assistance Equipment used: Rolling walker (2 wheels) Transfers: Sit to/from Stand Sit to Stand: Min assist           General transfer comment: min assist to stand from raised bed and min guard while standing     Balance Overall balance assessment: Needs assistance Sitting-balance support: Feet supported, No upper extremity supported Sitting balance-Leahy Scale: Fair (due to pain) Sitting balance - Comments: seated EOB   Standing balance support: Single extremity supported, Bilateral upper extremity supported, During functional activity Standing balance-Leahy Scale: Poor Standing balance comment: able to assist with clothing management in bathroom                           ADL either performed or assessed with clinical judgement   ADL Overall ADL's : Needs assistance/impaired                           Toilet Transfer Details (indicate cue type and reason): ambulated to bathroom and urinated standing with assistance for balance Toileting- Clothing Manipulation and Hygiene: Moderate assistance;Sit to/from stand Toileting - Clothing Manipulation Details (indicate cue type and reason): to manage clothing       General ADL Comments: limited due to pain    Extremity/Trunk Assessment              Vision       Perception     Praxis      Cognition Arousal/Alertness: Awake/alert Behavior During Therapy: [facility]  for tasks assessed/performed Overall Cognitive Status: Within Functional Limits for tasks assessed                                          Exercises      Shoulder Instructions       General Comments VSS on RA    Pertinent Vitals/ Pain       Pain Assessment Pain Assessment: Faces Faces Pain Scale: Hurts worst Pain Location: back  during mobility and movement, bil LE, L>R Pain Descriptors / Indicators: Grimacing, Discomfort, Guarding, Operative site guarding Pain Intervention(s): Limited activity within patient's tolerance, Monitored during session, Premedicated before session, Repositioned  Home Living                                          Prior Functioning/Environment              Frequency  Min 2X/week        Progress Toward Goals  OT Goals(current goals can now be found in the care plan section)  Progress towards OT goals: Progressing toward goals  Acute Rehab OT Goals Patient Stated Goal: decreased pain OT Goal Formulation: With patient/family Time For Goal Achievement: 01/24/22 Potential to Achieve Goals: Good ADL Goals Pt Will Perform Upper Body Bathing: with min assist;sitting Pt Will Perform Upper Body Dressing: with min assist;sitting Pt Will Transfer to Toilet: with min guard assist;ambulating;bedside commode Pt Will Perform Toileting - Clothing Manipulation and hygiene: sit to/from stand;with min assist Additional ADL Goal #1: Pt will increase bed mobility to Min A while maintaining back precautions and transition to sitting on EOB to progress out of bed mobility.  Plan Discharge plan remains appropriate    Co-evaluation                 AM-PAC OT "6 Clicks" Daily Activity     Outcome Measure   Help from another person eating meals?: None Help from another person taking care of personal grooming?: None Help from another person toileting, which includes using toliet, bedpan, or urinal?: Total Help from another person bathing (including washing, rinsing, drying)?: A Lot Help from another person to put on and taking off regular upper body clothing?: A Little Help from another person to put on and taking off regular lower body clothing?: A Lot 6 Click Score: 16    End of Session Equipment Utilized During Treatment: Back brace;Rolling walker (2  wheels)  OT Visit Diagnosis: Muscle weakness (generalized) (M62.81);Unsteadiness on feet (R26.81)   Activity Tolerance Patient limited by pain   Patient Left in bed;with call bell/phone within reach;with family/visitor present   Nurse Communication Mobility status        Time: 0932-3557 OT Time Calculation (min): 16 min  Charges: OT General Charges $OT Visit: 1 Visit OT Treatments $Self Care/Home Management : 8-22 mins  Alfonse Flavors, OTA Acute Rehabilitation Services  Office [phone]   Dewain Penning 01/11/2022, 2:00 PM

## 2022-01-11 NOTE — Progress Notes (Signed)
Physical Therapy Treatment Patient Details Name: Kenneth [name] MRN: [id number] DOB: 1975/10/20 Today's Date: 01/11/2022   History of Present Illness Pt is a 46 y/o male S/P L4-5 L5-S1 disc instrumentation and fusion completed on 01/09/22.    PT Comments    Pt up in bathroom on arrival and agreeable to session, however limited by increased pain. Pt needing increased assist during ambulation with RW to steady as pt with increased pain and instability in LLE with distance limited by pain. Pt needing up to mod assist to manage LE and for sequencing cues for bed mobility to return to supine via log-rolling technique. Education reinforced re; precautions, brace application/wearing schedule and appropriate activity progression  Pt continues to benefit from skilled PT services to progress toward functional mobility goals.    Recommendations for follow up therapy are one component of a multi-disciplinary discharge planning process, led by the attending physician.  Recommendations may be updated based on patient status, additional functional criteria and insurance authorization.  Follow Up Recommendations  No PT follow up     Assistance Recommended at Discharge PRN  Patient can return home with the following A little help with walking and/or transfers;A little help with bathing/dressing/bathroom;Assistance with cooking/housework;Assist for transportation;Help with stairs or ramp for entrance   Equipment Recommendations  None recommended by PT    Recommendations for Other Services       Precautions / Restrictions Precautions Precautions: Back Precaution Booklet Issued: Yes (comment) Precaution Comments: provided handout Required Braces or Orthoses: Spinal Brace Spinal Brace: Lumbar corset;Applied in standing position Restrictions Weight Bearing Restrictions: No     Mobility  Bed Mobility Overal bed mobility: Needs Assistance Bed Mobility: Sit to Sidelying, Rolling Rolling: Min  assist, Mod assist       Sit to sidelying: Mod assist General bed mobility comments: Required VC to maintain back precautions and for log roll technique. Bed height elevated, increased assist secondary to pain    Transfers Overall transfer level: Needs assistance Equipment used: Rolling walker (2 wheels) Transfers: Sit to/from Stand Sit to Stand: Min guard           General transfer comment: min guard to come to sit, pt up in BR on arrival    Ambulation/Gait Ambulation/Gait assistance: Min assist Gait Distance (Feet): 75 Feet Assistive device: Rolling walker (2 wheels) Gait Pattern/deviations: Step-through pattern, Decreased stride length, Trunk flexed Gait velocity: Decreased     General Gait Details: VC's for improved posture, closer walker proximity, and forward gaze. assist to steady as pt with icnreased pain and LLE instability   Stairs             Wheelchair Mobility    Modified Rankin (Stroke Patients Only)       Balance Overall balance assessment: Needs assistance Sitting-balance support: Feet supported, No upper extremity supported Sitting balance-Leahy Scale: Fair (due to pain level) Sitting balance - Comments: seated EOB   Standing balance support: Bilateral upper extremity supported, Reliant on assistive device for balance Standing balance-Leahy Scale: Poor Standing balance comment: RW to maintain standing balance.                            Cognition Arousal/Alertness: Awake/alert Behavior During Therapy: WFL for tasks assessed/performed Overall Cognitive Status: Within Functional Limits for tasks assessed  Exercises      General Comments General comments (skin integrity, edema, etc.): VSS on RA, pt with increased pain limited mobility progression      Pertinent Vitals/Pain Pain Assessment Pain Assessment: Faces Faces Pain Scale: Hurts worst Pain Location: back  during mobility and movement, bil LE, L>R Pain Descriptors / Indicators: Grimacing, Discomfort, Guarding, Operative site guarding Pain Intervention(s): Premedicated before session, Monitored during session, Limited activity within patient's tolerance    Home Living                          Prior Function            PT Goals (current goals can now be found in the care plan section) Acute Rehab PT Goals PT Goal Formulation: With patient/family Time For Goal Achievement: 01/17/22 Progress towards PT goals: Not progressing toward goals - comment (increased pain)    Frequency    Min 5X/week      PT Plan      Co-evaluation              AM-PAC PT "6 Clicks" Mobility   Outcome Measure  Help needed turning from your back to your side while in a flat bed without using bedrails?: A Little Help needed moving from lying on your back to sitting on the side of a flat bed without using bedrails?: A Little Help needed moving to and from a bed to a chair (including a wheelchair)?: A Lot Help needed standing up from a chair using your arms (e.g., wheelchair or bedside chair)?: A Lot Help needed to walk in hospital room?: A Little Help needed climbing 3-5 steps with a railing? : A Little 6 Click Score: 16    End of Session Equipment Utilized During Treatment: Gait belt;Back brace Activity Tolerance: Patient limited by pain Patient left: in bed;with call bell/phone within reach;with family/visitor present Nurse Communication: Mobility status PT Visit Diagnosis: Unsteadiness on feet (R26.81);Pain Pain - part of body:  (back)     Time: 2426-8341 PT Time Calculation (min) (ACUTE ONLY): 25 min  Charges:  $Gait Training: 8-22 mins $Therapeutic Activity: 8-22 mins                     Bentlie Withem R. PTA Acute Rehabilitation Services Office: [phone]    Catalina Antigua 01/11/2022, 10:30 AM

## 2022-01-11 NOTE — Progress Notes (Signed)
   Providing Compassionate, Quality Care - Together   Subjective: Patient reports significant pain and burning into bilateral lower extremities.  Objective: Vital signs in last 24 hours: Temp:  [98 F (36.7 C)-100.2 F (37.9 C)] 98.2 F (36.8 C) (12/23 0840) Pulse Rate:  [96-131] 96 (12/23 0840) Resp:  [16] 16 (12/23 0840) BP: (128-136)/(74-82) 136/82 (12/23 0840) SpO2:  [92 %-97 %] 92 % (12/23 0840)  Intake/Output from previous day: 12/22 0701 - 12/23 0700 In: -  Out: 350 [Urine:350] Intake/Output this shift: No intake/output data recorded.  Alert and oriented x 4 PERRLA CN II-XII grossly intact MAE, Strength and sensation intact Incision is covered with Honeycomb dressing and Steri Strips; Dressing is clean, dry, and intact   Lab Results: No results for input(s): "WBC", "HGB", "HCT", "PLT" in the last 72 hours. BMET No results for input(s): "NA", "K", "CL", "CO2", "GLUCOSE", "BUN", "CREATININE", "CALCIUM" in the last 72 hours.  Studies/Results: DG Lumbar Spine 2-3 Views  Result Date: 01/09/2022 CLINICAL DATA:  Surgery, elective EXAM: LUMBAR SPINE - 2-3 VIEW COMPARISON:  Same day radiograph, CT 07/15/2021 FINDINGS: Intraoperative images during L4-S1 lumbosacral fusion. Pedicle screws and intervertebral disc spaces are in place. Posterior fusion rods not yet in place. Tissue retractors noted. IMPRESSION: Intraoperative images during L4-S1 lumbosacral fusion. Electronically Signed   By: Caprice Renshaw M.D.   On: 01/09/2022 15:57   DG C-Arm 1-60 Min-No Report  Result Date: 01/09/2022 Fluoroscopy was utilized by the requesting physician.  No radiographic interpretation.   DG C-Arm 1-60 Min-No Report  Result Date: 01/09/2022 Fluoroscopy was utilized by the requesting physician.  No radiographic interpretation.    Assessment/Plan: Patient underwent L4-5 and L5-S1 posterior lumbar fusion by Dr. Lovell Sheehan on 01/09/2022. His postoperative course has been complicated by  pain.   LOS: 0 days   -Added scheduled Toradol in hopes of getting his pain under better control -Hopefull, patient can discharge home tomorrow.   Val Eagle, DNP, AGNP-C Nurse Practitioner  [facility] [address], [address], [place], [place] [address] P: [phone]    F: [phone]  01/11/2022, 12:37 PM

## 2022-01-12 DIAGNOSIS — M4317 Spondylolisthesis, lumbosacral region: Secondary | ICD-10-CM | POA: Diagnosis not present

## 2022-01-12 MED ORDER — ORAL CARE MOUTH RINSE: 15.0000 mL | OROMUCOSAL | Status: DC | PRN

## 2022-01-12 MED ORDER — OXYCODONE-ACETAMINOPHEN 5-325 MG PO TABS: 1.0000 | ORAL_TABLET | Freq: Four times a day (QID) | ORAL | 0 refills | Status: AC | PRN

## 2022-01-12 MED ORDER — CYCLOBENZAPRINE HCL 10 MG PO TABS: 10.0000 mg | ORAL_TABLET | Freq: Three times a day (TID) | ORAL | 2 refills | Status: AC | PRN

## 2022-01-12 MED ORDER — DOCUSATE SODIUM 100 MG PO CAPS: 100.0000 mg | ORAL_CAPSULE | Freq: Two times a day (BID) | ORAL | 2 refills | Status: AC

## 2022-01-12 NOTE — Progress Notes (Signed)
Physical Therapy Treatment Patient Details Name: Kenneth [name] MRN: [id number] DOB: 24-Apr-1975 Today's Date: 01/12/2022   History of Present Illness Pt is a 46 y/o male S/P L4-5 L5-S1 disc instrumentation and fusion completed on 01/09/22.    PT Comments    Pt states he is doing much better today. Feels more comfortable with DC home now. Completed stair training. I have answered all patient's question regarding PT and mobility.    I have encouraged the patient to gradually increase activity daily to tolerance.       Recommendations for follow up therapy are one component of a multi-disciplinary discharge planning process, led by the attending physician.  Recommendations may be updated based on patient status, additional functional criteria and insurance authorization.  Follow Up Recommendations  No PT follow up     Assistance Recommended at Discharge PRN  Patient can return home with the following A little help with walking and/or transfers;A little help with bathing/dressing/bathroom;Assistance with cooking/housework;Assist for transportation;Help with stairs or ramp for entrance   Equipment Recommendations  Rolling walker (2 wheels);Other (comment) (would benefit from bariatric size)    Recommendations for Other Services       Precautions / Restrictions Precautions Precautions: Back Precaution Booklet Issued: Yes (comment) Pt able to state 3/3 back precautions.  Donned brace with set-up assist Precaution Comments: provided handout Required Braces or Orthoses: Spinal Brace Spinal Brace: Lumbar corset;Applied in standing position Restrictions Weight Bearing Restrictions: No     Mobility  Bed Mobility Overal bed mobility:  (NT, pt up in chair)                  Transfers Overall transfer level: Needs assistance Equipment used: Rolling walker (2 wheels) Transfers: Sit to/from Stand Sit to Stand: Supervision           General transfer comment: Increased  time but able to perfiorm safely    Ambulation/Gait Ambulation/Gait assistance: Supervision Gait Distance (Feet): 100 Feet Assistive device: Rolling walker (2 wheels) Gait Pattern/deviations: Step-through pattern, Decreased stride length Gait velocity: Decreased         Stairs Stairs: Yes Stairs assistance: Min guard Stair Management: One rail Right, Step to pattern, Forwards Number of Stairs: 2 General stair comments: performed twice   Wheelchair Mobility    Modified Rankin (Stroke Patients Only)       Balance                                            Cognition Arousal/Alertness: Awake/alert Behavior During Therapy: WFL for tasks assessed/performed Overall Cognitive Status: Within Functional Limits for tasks assessed                                          Exercises      General Comments General comments (skin integrity, edema, etc.): No family present      Pertinent Vitals/Pain Pain Assessment Pain Assessment: 0-10 Pain Score: 4  Pain Location: low back Pain Descriptors / Indicators: Discomfort, Guarding Pain Intervention(s): Limited activity within patient's tolerance, Monitored during session    Home Living                          Prior Function  PT Goals (current goals can now be found in the care plan section) Progress towards PT goals: Progressing toward goals    Frequency    Min 5X/week      PT Plan Current plan remains appropriate    Co-evaluation              AM-PAC PT "6 Clicks" Mobility   Outcome Measure  Help needed turning from your back to your side while in a flat bed without using bedrails?: A Little Help needed moving from lying on your back to sitting on the side of a flat bed without using bedrails?: A Little Help needed moving to and from a bed to a chair (including a wheelchair)?: A Little Help needed standing up from a chair using your arms (e.g.,  wheelchair or bedside chair)?: A Little Help needed to walk in hospital room?: A Little Help needed climbing 3-5 steps with a railing? : A Little 6 Click Score: 18    End of Session Equipment Utilized During Treatment: Gait belt;Back brace Activity Tolerance: Patient tolerated treatment well Patient left: in chair;with call bell/phone within reach Nurse Communication: Mobility status PT Visit Diagnosis: Unsteadiness on feet (R26.81);Pain     Time: 7903-8333 PT Time Calculation (min) (ACUTE ONLY): 34 min  Charges:  $Gait Training: 23-37 mins                     Lavona Mound, PT   Acute Rehabilitation Services  Office [phone] 01/12/2022    Donnella Sham 01/12/2022, 10:54 AM

## 2022-01-12 NOTE — Plan of Care (Signed)

## 2022-01-12 NOTE — Discharge Summary (Signed)
  Physician Discharge Summary  Patient ID: Kenneth [name] MRN: [id number] DOB/AGE: 08/21/1975 46 y.o.  Admit date: 01/09/2022 Discharge date: 01/12/2022  Admission Diagnoses:  Spondylolisthesis of lumbar spine  Discharge Diagnoses:  Same Principal Problem:   Spondylolisthesis of lumbar region   Discharged Condition: Stable  Hospital Course:  Kenneth [name] is a 46 y.o. male who underwent elecitve L4-S1 TLIF for spondylolisthesis.  Postoperatively he was mobilized with the help of PT and OT.  On 12/24, he was deemed ready for discharge home.  His pain was well-controlled with p.o. pain medications.  He was ambulating without difficulty and voiding without difficulty.     Treatments: Surgery - L4-5, L5-S1 TLIF  Discharge Exam: Blood pressure 104/67, pulse 87, temperature 98.3 F (36.8 C), temperature source Oral, resp. rate 17, height 6\' 1"  (1.854 m), weight (!) 149.7 kg, SpO2 99 %. Awake, alert, oriented Speech fluent, appropriate CN grossly intact 5/5 BUE/BLE Persistent numbness in right upper aspect of lower leg Wound c/d/i  Disposition: Discharge disposition: 01-Home or Self Care        Allergies as of 01/12/2022       Reactions   Hydrocodone-acetaminophen Itching        Medication List     TAKE these medications    albuterol 108 (90 Base) MCG/ACT inhaler Commonly known as: VENTOLIN HFA Inhale 2 puffs into the lungs every 4 (four) hours as needed for wheezing or shortness of breath.   cetirizine 10 MG tablet Commonly known as: ZYRTEC Take 10 mg by mouth daily as needed for allergies.   cyclobenzaprine 10 MG tablet Commonly known as: FLEXERIL Take 1 tablet (10 mg total) by mouth 3 (three) times daily as needed for muscle spasms.   docusate sodium 100 MG capsule Commonly known as: Colace Take 1 capsule (100 mg total) by mouth 2 (two) times daily.   fluticasone 50 MCG/ACT nasal spray Commonly known as: FLONASE Place 1 spray into both  nostrils in the morning and at bedtime.   oxyCODONE-acetaminophen 5-325 MG tablet Commonly known as: Percocet Take 1-2 tablets by mouth every 6 (six) hours as needed for severe pain.   pantoprazole 40 MG tablet Commonly known as: PROTONIX Take 40 mg by mouth daily.   rosuvastatin 20 MG tablet Commonly known as: CRESTOR Take 20 mg by mouth at bedtime.   tamsulosin 0.4 MG Caps capsule Commonly known as: FLOMAX Take 0.4 mg by mouth daily.               Durable Medical Equipment  (From admission, onward)           Start     Ordered   01/12/22 1058  For home use only DME Walker rolling  Once       Comments: Bariatric, Heavy Duty  Weight 149.7 kg  Question Answer Comment  Walker: With 5 Inch Wheels   Patient needs a walker to treat with the following condition Decreased functional mobility and endurance      01/12/22 1057            Follow-up Information     01/14/22, MD Follow up in 12 day(s).   Specialty: Neurosurgery Contact information: [address] [place] [phone]                 Signed: [phone] 01/12/2022, 11:25 AM

## 2022-01-12 NOTE — Progress Notes (Signed)
Walker arrived - pt calling family to pick pt up.  Will continue to monitor until transportation home arrives.

## 2022-01-12 NOTE — Discharge Instructions (Signed)
Can remove transparent dressing and shower 72 hours after surgery Walk as much as possible No heavy lifting >10 lbs No excessive bending/twisting at the waist  

## 2022-01-12 NOTE — TOC Transition Note (Signed)
Transition of Care [facility]) - CM/SW Discharge Note   Patient Details  Name: Kenneth [name] MRN: [id number] Date of Birth: 1975/01/27  [facility] CM/SW Contact:  Bess Kinds, RN Phone Number: [phone] 01/12/2022, 10:58 AM   Clinical Narrative:     Spoke with patient on hospital room phone to discuss post acute transition. PTA home with wife. Discussed PT recommendations for bariatric RW - patient agreeable. Referral accepted by [employer] for delivery to the room. Wife to provide transportation home. No further TOC needs identified at this time.   Final next level of care: Home/Self Care Barriers to Discharge: No Barriers Identified   Patient Goals and CMS Choice CMS Medicare.gov Compare Post Acute Care list provided to:: Patient Choice offered to / list presented to : Patient  Discharge Placement                         Discharge Plan and Services Additional resources added to the After Visit Summary for                  DME Arranged: Walker rolling DME Agency: [employer] Date DME Agency Contacted: 01/12/22 Time DME Agency Contacted: 1058 Representative spoke with at DME Agency: Jasmine HH Arranged: NA HH Agency: NA        Social Determinants of Health (SDOH) Interventions SDOH Screenings   Tobacco Use: High Risk (01/09/2022)     Readmission Risk Interventions     No data to display

## 2022-01-12 NOTE — Progress Notes (Signed)
Order to discharge pt home.  Discharge instructions/AVS given to patient and reviewed - education provided as needed.  Pt advised to call PCP and/or come back to the hospital if there are any problems. Pt verbalized understanding.    Pt waiting on walker to arrive prior to discharge.

## 2022-02-21 MED FILL — Sodium Chloride Irrigation Soln 0.9%: Qty: 3000 | Status: AC

## 2022-02-21 MED FILL — Heparin Sodium (Porcine) Inj 1000 Unit/ML: INTRAMUSCULAR | Qty: 30 | Status: AC

## 2022-02-21 MED FILL — Sodium Chloride IV Soln 0.9%: INTRAVENOUS | Qty: 1000 | Status: AC

## 2023-02-22 NOTE — Progress Notes (Signed)
 Patient ID:  Kenneth [name] is a 48 y.o. (DOB 17-Aug-1975) male  Assessment and Plan   1. Influenza A (Primary) -     oseltamivir phosphate (TAMIFLU) 75 mg capsule; Take one capsule (75 mg dose) by mouth 2 (two) times daily for 5 days., Starting Sun 02/22/2023, Until Fri 02/27/2023, Normal 2. Cough, unspecified type -     Albuterol -Budesonide (AIRSUPRA) 90-80 MCG/ACT AERO; Inhale 2 puffs into the lungs every 6 (six) hours as needed., Starting Sun 02/22/2023, Normal    He has a presentation consistent with influenza  A and viral cough.  No signs of respiratory distress.  Counseled on reasons to schedule a follow-up appointment or seek emergency care.  Medications prescribed as above. Tamiflu and Airsupra prn. Recommend Airsupra instead of albuterol  prn. Discussed this with patient in detail. Co pay card provided with education Patient expressed understanding of instructions.   Advised sleeping with head elevated and humidification if possible to relieve night-time symptoms,  tylenol  as needed for fever. Recommend zinc , vitamin D, vitamin C, rest and fluids Vicks Vapor rub, warm liquids throat lozenges   Pre visit planning was completed via snapshot and review of chart.  I reviewed the patient instructions on the AVS with the patient/family verbalized to me that they understood what their problem is, what they need to do about it, and why it is important that they do it.  The patient/family voices understanding of all medications. No barriers to adherence were noted. Patient is taking all medications as prescribed and is tolerating well.  Plan for follow-up as discussed or as needed if any worsening symptoms or change in condition.  After Visit Summary was given to patient/or sent to MyChart. Risks, benefits, and alternatives of the medications and treatment plan prescribed today were discussed, and patient expressed understanding. Plan follow-up as discussed or as needed if any worsening  symptoms or change in condition. Follow up if symptoms worsen or fail to improve, for follow up with your PCP, Go to the ER if symptoms worsen   History   Kenneth [name] presents with respiratory complaints that started 2 day(s) ago. Symptoms include  Chief Complaint  Patient presents with  . Fever    Chills, runny nose, headache started last night     The severity of the symptoms is: moderate Pertinent negative symptoms include: neither wheezing nor shortness of breath nor nausea nor vomiting nor diarrhea  The symptom progression has been: worsening Symptoms have been aggravated by: activity and lying down Treatments tried include: albuterol  Comorbidities: asthma (mild)   Reviewed and updated this visit by provider: Tobacco  Allergies  Meds  Problems  Med Hx  Surg Hx  Fam Hx        ROS:  As documented in the history above, all other relevant system complaints were negative.  Objective   Vitals:   02/22/23 1111  BP: 150/75  Patient Position: Sitting  Pulse: 116  Temp: (!) 102 F (38.9 C)  TempSrc: Tympanic  Resp: 16  Weight: (!) 350 lb (158.8 kg)  SpO2: 99%   appears well, febrile and fatigue, no respiratory distress, acyanotic, normal RR, ear and throat exam is normal, nasal congestion with pale boggy mucosa noted, neck free of mass or lymphadenopathy, chest clear, no wheezing, crepitations, rhonchi, normal symmetric air entry. Cough, dry. Non productive  I have spent the following amount of time in direct patient care for this encounter with greater than 50% in counseling and coordination of care: 15  to less than 25 minutes
